# Patient Record
Sex: Male | Born: 1985
Health system: Southern US, Community
[De-identification: ages and names within clinical notes are randomized; demographics above are authoritative.]

## PROBLEM LIST (undated history)

## (undated) DIAGNOSIS — F909 Attention-deficit hyperactivity disorder, unspecified type: Secondary | ICD-10-CM

## (undated) HISTORY — PX: WISDOM TOOTH EXTRACTION: SHX21

## (undated) HISTORY — PX: APPENDECTOMY: SHX54

---
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
## 2015-07-26 ENCOUNTER — Ambulatory Visit: Payer: Self-pay | Admitting: Pediatrics

## 2017-07-30 ENCOUNTER — Ambulatory Visit (INDEPENDENT_AMBULATORY_CARE_PROVIDER_SITE_OTHER): Payer: BLUE CROSS/BLUE SHIELD | Admitting: Family

## 2017-07-30 ENCOUNTER — Other Ambulatory Visit: Payer: Self-pay | Admitting: Family

## 2017-07-30 ENCOUNTER — Encounter: Payer: Self-pay | Admitting: Family

## 2017-07-30 VITALS — BP 122/73 | HR 70 | Temp 99.6°F | Ht 70.0 in | Wt 173.0 lb

## 2017-07-30 DIAGNOSIS — B349 Viral infection, unspecified: Secondary | ICD-10-CM | POA: Diagnosis not present

## 2017-07-30 DIAGNOSIS — J029 Acute pharyngitis, unspecified: Secondary | ICD-10-CM

## 2017-07-30 LAB — RAPID STREP SCREEN (MED CTR MEBANE ONLY): STREP GP A AG, IA W/REFLEX: POSITIVE — AB

## 2017-07-30 MED ORDER — AMOXICILLIN 875 MG PO TABS: 875.0000 mg | ORAL_TABLET | Freq: Two times a day (BID) | ORAL | 0 refills | Status: DC

## 2017-07-30 MED ORDER — FLUTICASONE PROPIONATE 50 MCG/ACT NA SUSP: 2.0000 | Freq: Every day | NASAL | 6 refills | Status: DC

## 2017-07-30 NOTE — Patient Instructions (Signed)
Upper Respiratory Infection, Adult Most upper respiratory infections (URIs) are a viral infection of the air passages leading to the lungs. A URI affects the nose, throat, and upper air passages. The most common type of URI is nasopharyngitis and is typically referred to as "the common cold." URIs run their course and usually go away on their own. Most of the time, a URI does not require medical attention, but sometimes a bacterial infection in the upper airways can follow a viral infection. This is called a secondary infection. Sinus and middle ear infections are common types of secondary upper respiratory infections. Bacterial pneumonia can also complicate a URI. A URI can worsen asthma and chronic obstructive pulmonary disease (COPD). Sometimes, these complications can require emergency medical care and may be life threatening. What are the causes? Almost all URIs are caused by viruses. A virus is a type of germ and can spread from one person to another. What increases the risk? You may be at risk for a URI if:  You smoke.  You have chronic heart or lung disease.  You have a weakened defense (immune) system.  You are very young or very old.  You have nasal allergies or asthma.  You work in crowded or poorly ventilated areas.  You work in health care facilities or schools.  What are the signs or symptoms? Symptoms typically develop 2-3 days after you come in contact with a cold virus. Most viral URIs last 7-10 days. However, viral URIs from the influenza virus (flu virus) can last 14-18 days and are typically more severe. Symptoms may include:  Runny or stuffy (congested) nose.  Sneezing.  Cough.  Sore throat.  Headache.  Fatigue.  Fever.  Loss of appetite.  Pain in your forehead, behind your eyes, and over your cheekbones (sinus pain).  Muscle aches.  How is this diagnosed? Your health care provider may diagnose a URI by:  Physical exam.  Tests to check that your  symptoms are not due to another condition such as: ? Strep throat. ? Sinusitis. ? Pneumonia. ? Asthma.  How is this treated? A URI goes away on its own with time. It cannot be cured with medicines, but medicines may be prescribed or recommended to relieve symptoms. Medicines may help:  Reduce your fever.  Reduce your cough.  Relieve nasal congestion.  Follow these instructions at home:  Take medicines only as directed by your health care provider.  Gargle warm saltwater or take cough drops to comfort your throat as directed by your health care provider.  Use a warm mist humidifier or inhale steam from a shower to increase air moisture. This may make it easier to breathe.  Drink enough fluid to keep your urine clear or pale yellow.  Eat soups and other clear broths and maintain good nutrition.  Rest as needed.  Return to work when your temperature has returned to normal or as your health care provider advises. You may need to stay home longer to avoid infecting others. You can also use a face mask and careful hand washing to prevent spread of the virus.  Increase the usage of your inhaler if you have asthma.  Do not use any tobacco products, including cigarettes, chewing tobacco, or electronic cigarettes. If you need help quitting, ask your health care provider. How is this prevented? The best way to protect yourself from getting a cold is to practice good hygiene.  Avoid oral or hand contact with people with cold symptoms.  Wash your   hands often if contact occurs.  There is no clear evidence that vitamin C, vitamin E, echinacea, or exercise reduces the chance of developing a cold. However, it is always recommended to get plenty of rest, exercise, and practice good nutrition. Contact a health care provider if:  You are getting worse rather than better.  Your symptoms are not controlled by medicine.  You have chills.  You have worsening shortness of breath.  You have  brown or red mucus.  You have yellow or brown nasal discharge.  You have pain in your face, especially when you bend forward.  You have a fever.  You have swollen neck glands.  You have pain while swallowing.  You have white areas in the back of your throat. Get help right away if:  You have severe or persistent: ? Headache. ? Ear pain. ? Sinus pain. ? Chest pain.  You have chronic lung disease and any of the following: ? Wheezing. ? Prolonged cough. ? Coughing up blood. ? A change in your usual mucus.  You have a stiff neck.  You have changes in your: ? Vision. ? Hearing. ? Thinking. ? Mood. This information is not intended to replace advice given to you by your health care provider. Make sure you discuss any questions you have with your health care provider. Document Released: 03/14/2001 Document Revised: 05/21/2016 Document Reviewed: 12/24/2013 Elsevier Interactive Patient Education  2017 [employer]  

## 2017-07-30 NOTE — Progress Notes (Signed)
   Subjective:    Patient ID: Richard Ali, male    DOB: 02-Dec-1985, 31 y.o.   MRN: [id number]  PT presents to the office today to establish care and complaints of sore throat.  Headache   This is a new problem. Associated symptoms include a fever, muscle aches and a sore throat. Pertinent negatives include no coughing, ear pain, nausea or vomiting.  Fever   This is a new problem. The current episode started yesterday. Associated symptoms include headaches, muscle aches and a sore throat. Pertinent negatives include no coughing, ear pain, nausea, sleepiness, urinary pain or vomiting. He has tried acetaminophen for the symptoms. The treatment provided mild relief.      Review of Systems  Constitutional: Positive for fever.  HENT: Positive for sore throat. Negative for ear pain.   Respiratory: Negative for cough.   Gastrointestinal: Negative for nausea and vomiting.  Genitourinary: Negative for dysuria.  Neurological: Positive for headaches.  All other systems reviewed and are negative.      Objective:   Physical Exam  Constitutional: He is oriented to person, place, and time. He appears well-developed and well-nourished. No distress.  HENT:  Head: Normocephalic.  Right Ear: External ear normal.  Left Ear: External ear normal.  Nose: Mucosal edema and rhinorrhea present.  Mouth/Throat: Posterior oropharyngeal erythema present.  Eyes: Pupils are equal, round, and reactive to light. Right eye exhibits no discharge. Left eye exhibits no discharge.  Neck: Normal range of motion. Neck supple. No thyromegaly present.  Cardiovascular: Normal rate, regular rhythm, normal heart sounds and intact distal pulses.   No murmur heard. Pulmonary/Chest: Effort normal and breath sounds normal. No respiratory distress. He has no wheezes.  Abdominal: Soft. Bowel sounds are normal. He exhibits no distension. There is no tenderness.  Musculoskeletal: Normal range of motion. He exhibits no edema  or tenderness.  Neurological: He is alert and oriented to person, place, and time.  Skin: Skin is warm and dry. No rash noted. No erythema.  Psychiatric: He has a normal mood and affect. His behavior is normal. Judgment and thought content normal.  Vitals reviewed.     BP 122/73   Pulse 70   Temp 99.6 F (37.6 C) (Oral)   Ht 5\' 10"  (1.778 m)   Wt 173 lb (78.5 kg)   BMI 24.82 kg/m      Assessment & Plan:  1. Viral illness - fluticasone (FLONASE) 50 MCG/ACT nasal spray; Place 2 sprays into both nostrils daily.  Dispense: 16 g; Refill: 6  2. Sore throat - Take meds as prescribed - Use a cool mist humidifier  -Use saline nose sprays frequently -Force fluids -For any cough or congestion  Use plain Mucinex- regular strength or max strength is fine -For fever or aces or pains- take tylenol or ibuprofen appropriate for age and weight. -Throat lozenges if help -New toothbrush in 3 days - fluticasone (FLONASE) 50 MCG/ACT nasal spray; Place 2 sprays into both nostrils daily.  Dispense: 16 g; Refill: 6 - Rapid strep screen ([facility])   Jannifer Rodneyhristy Arlena Marsan, FNP

## 2017-07-30 NOTE — Addendum Note (Signed)
Addended by: Jannifer RodneyHAWKS, Miachel Nardelli A on: 07/30/2017 03:49 PM   Modules accepted: Level of Service

## 2020-02-04 ENCOUNTER — Ambulatory Visit (INDEPENDENT_AMBULATORY_CARE_PROVIDER_SITE_OTHER): Payer: 59 | Admitting: Family

## 2020-02-04 ENCOUNTER — Other Ambulatory Visit: Payer: Self-pay

## 2020-02-04 ENCOUNTER — Encounter: Payer: Self-pay | Admitting: Family

## 2020-02-04 ENCOUNTER — Ambulatory Visit: Payer: BLUE CROSS/BLUE SHIELD | Admitting: Family

## 2020-02-04 VITALS — BP 127/83 | HR 97 | Temp 98.1°F | Ht 70.0 in | Wt 196.4 lb

## 2020-02-04 DIAGNOSIS — F902 Attention-deficit hyperactivity disorder, combined type: Secondary | ICD-10-CM | POA: Diagnosis not present

## 2020-02-04 DIAGNOSIS — F909 Attention-deficit hyperactivity disorder, unspecified type: Secondary | ICD-10-CM | POA: Diagnosis not present

## 2020-02-04 DIAGNOSIS — Z79899 Other long term (current) drug therapy: Secondary | ICD-10-CM | POA: Diagnosis not present

## 2020-02-04 DIAGNOSIS — Z23 Encounter for immunization: Secondary | ICD-10-CM

## 2020-02-04 MED ORDER — LISDEXAMFETAMINE DIMESYLATE 30 MG PO CAPS: 30.0000 mg | ORAL_CAPSULE | Freq: Every day | ORAL | 0 refills | Status: DC

## 2020-02-04 MED FILL — VYVANSE 30 MG CAPSULE: 30 | 30 days supply | Qty: 30 | Fill #0

## 2020-02-04 NOTE — Patient Instructions (Signed)

## 2020-02-04 NOTE — Progress Notes (Signed)
Subjective:    Patient ID: Richard Ali, male    DOB: [birth year], 34 y.o.   MRN: [id number]  Chief Complaint  Patient presents with  . ADHD    HPI Pt presents to the office today to discuss ADHD. He states he has troubling completing tasks at home. He states his wife will tell him several things to completed and he gets distracted and never completes. He states he struggled in school, but was able to pass. He is currently in college, but doing virtual learning his grades A, B's, and C's. He states he has can not stay on topic to complete his work.    Review of Systems  All other systems reviewed and are negative.      Objective:   Physical Exam Vitals reviewed.  Constitutional:      General: He is not in acute distress.    Appearance: He is well-developed.  HENT:     Head: Normocephalic.     Right Ear: Tympanic membrane normal.     Left Ear: Tympanic membrane normal.  Eyes:     General:        Right eye: No discharge.        Left eye: No discharge.     Pupils: Pupils are equal, round, and reactive to light.  Neck:     Thyroid: No thyromegaly.  Cardiovascular:     Rate and Rhythm: Normal rate and regular rhythm.     Heart sounds: Normal heart sounds. No murmur.  Pulmonary:     Effort: Pulmonary effort is normal. No respiratory distress.     Breath sounds: Normal breath sounds. No wheezing.  Abdominal:     General: Bowel sounds are normal. There is no distension.     Palpations: Abdomen is soft.     Tenderness: There is no abdominal tenderness.  Musculoskeletal:        General: No tenderness. Normal range of motion.     Cervical back: Normal range of motion and neck supple.  Skin:    General: Skin is warm and dry.     Findings: No erythema or rash.  Neurological:     Mental Status: He is alert and oriented to person, place, and time.     Cranial Nerves: No cranial nerve deficit.     Deep Tendon Reflexes: Reflexes are normal and symmetric.  Psychiatric:         Behavior: Behavior normal.        Thought Content: Thought content normal.        Judgment: Judgment normal.      BP 127/83   Pulse 97   Temp 98.1 F (36.7 C)   Ht 5\' 10"  (1.778 m)   Wt 196 lb 6.4 oz (89.1 kg)   SpO2 97%   BMI 28.18 kg/m         Assessment & Plan:  ROSBEL BUCKNER comes in today with chief complaint of ADHD   Diagnosis and orders addressed:  1. Attention deficit hyperactivity disorder (ADHD), combined type Meds as prescribed Behavior modification as needed Follow-up for recheck in 3 months - ToxASSURE Select 13 (MW), Urine - lisdexamfetamine (VYVANSE) 30 MG capsule; Take 1 capsule (30 mg total) by mouth daily before breakfast.  Dispense: 30 capsule; Refill: 0 - lisdexamfetamine (VYVANSE) 30 MG capsule; Take 1 capsule (30 mg total) by mouth daily before breakfast.  Dispense: 30 capsule; Refill: 0 - lisdexamfetamine (VYVANSE) 30 MG capsule; Take 1 capsule (30 mg total)  by mouth daily before breakfast.  Dispense: 30 capsule; Refill: 0  2. Attention deficit hyperactivity disorder (ADHD), unspecified ADHD type - ToxASSURE Select 13 (MW), Urine  3. Controlled substance agreement signed  4. Need for Tdap vaccination  - Tdap vaccine greater than or equal to 7yo IM   Follow up plan: 3  Months   Evelina Dun, FNP

## 2020-02-09 LAB — TOXASSURE SELECT 13 (MW), URINE

## 2020-05-06 ENCOUNTER — Encounter: Payer: Self-pay | Admitting: Family

## 2020-05-06 ENCOUNTER — Other Ambulatory Visit: Payer: Self-pay

## 2020-05-06 ENCOUNTER — Ambulatory Visit (INDEPENDENT_AMBULATORY_CARE_PROVIDER_SITE_OTHER): Payer: 59 | Admitting: Family

## 2020-05-06 VITALS — BP 124/77 | HR 78 | Temp 98.1°F | Ht 70.0 in | Wt 188.4 lb

## 2020-05-06 DIAGNOSIS — F909 Attention-deficit hyperactivity disorder, unspecified type: Secondary | ICD-10-CM

## 2020-05-06 DIAGNOSIS — Z79899 Other long term (current) drug therapy: Secondary | ICD-10-CM

## 2020-05-06 MED ORDER — ATOMOXETINE HCL 40 MG PO CAPS: 40.0000 mg | ORAL_CAPSULE | Freq: Every day | ORAL | 2 refills | Status: DC

## 2020-05-06 NOTE — Progress Notes (Signed)
Subjective:    Patient ID: Richard Ali, male    DOB: 03-08-1986, 34 y.o.   MRN: [id number]  Chief Complaint  Patient presents with  . ADHD    discuss maybe a change in medication, can not sleep on medication     HPI PT presents to the office today for ADHD follow up. He reports the Vyvanse 30 mg is helping him stay focused at school and completing tasks. However, he states it has causes insomnia. He has tried taking as early as 7 AM, but would have difficulty falling a sleep. He has a newborn at home and needs to be able to fall asleep when he can. He is requesting to try another medication.   Review of Systems  Constitutional: Negative.   HENT: Negative.   Respiratory: Negative.   Cardiovascular: Negative.   Gastrointestinal: Negative.   Endocrine: Negative.   Genitourinary: Negative.   Musculoskeletal: Negative.   Neurological: Negative.   Hematological: Negative.   Psychiatric/Behavioral: Negative.   All other systems reviewed and are negative.      Objective:   Physical Exam Vitals reviewed.  Constitutional:      General: He is not in acute distress.    Appearance: He is well-developed.  HENT:     Head: Normocephalic.     Right Ear: Tympanic membrane normal.     Left Ear: Tympanic membrane normal.  Eyes:     General:        Right eye: No discharge.        Left eye: No discharge.     Pupils: Pupils are equal, round, and reactive to light.  Neck:     Thyroid: No thyromegaly.  Cardiovascular:     Rate and Rhythm: Normal rate and regular rhythm.     Heart sounds: Normal heart sounds. No murmur heard.   Pulmonary:     Effort: Pulmonary effort is normal. No respiratory distress.     Breath sounds: Normal breath sounds. No wheezing.  Abdominal:     General: Bowel sounds are normal. There is no distension.     Palpations: Abdomen is soft.     Tenderness: There is no abdominal tenderness.  Musculoskeletal:        General: No tenderness. Normal range of  motion.     Cervical back: Normal range of motion and neck supple.  Skin:    General: Skin is warm and dry.     Findings: No erythema or rash.  Neurological:     Mental Status: He is alert and oriented to person, place, and time.     Cranial Nerves: No cranial nerve deficit.     Deep Tendon Reflexes: Reflexes are normal and symmetric.  Psychiatric:        Behavior: Behavior normal.        Thought Content: Thought content normal.        Judgment: Judgment normal.     BP 124/77   Pulse 78   Temp 98.1 F (36.7 C) (Temporal)   Ht 5\' 10"  (1.778 m)   Wt 188 lb 6.4 oz (85.5 kg)   SpO2 98%   BMI 27.03 kg/m        Assessment & Plan:  Richard Ali comes in today with chief complaint of ADHD (discuss maybe a change in medication, can not sleep on medication )   Diagnosis and orders addressed:  1. Attention deficit hyperactivity disorder (ADHD), unspecified ADHD type Will stop Vyvanse 30 mg and start  Strattera  Meds as prescribed Behavior modification as needed Follow-up for recheck in 3 months - atomoxetine (STRATTERA) 40 MG capsule; Take 1 capsule (40 mg total) by mouth daily.  Dispense: 30 capsule; Refill: 2  2. Controlled substance agreement signed   Health Maintenance reviewed Diet and exercise encouraged  Follow up plan: 3 months    Richard Rodney, FNP

## 2020-05-06 NOTE — Patient Instructions (Signed)

## 2020-05-21 ENCOUNTER — Other Ambulatory Visit: Payer: Self-pay | Admitting: Family

## 2020-05-21 MED ORDER — LISDEXAMFETAMINE DIMESYLATE 30 MG PO CAPS: 30.0000 mg | ORAL_CAPSULE | Freq: Every day | ORAL | 0 refills | Status: DC

## 2020-05-25 ENCOUNTER — Other Ambulatory Visit: Payer: Self-pay | Admitting: Family

## 2020-05-25 MED ORDER — AMPHETAMINE-DEXTROAMPHET ER 10 MG PO CP24: 10.0000 mg | ORAL_CAPSULE | ORAL | 0 refills | Status: DC

## 2020-06-01 DIAGNOSIS — Z20828 Contact with and (suspected) exposure to other viral communicable diseases: Secondary | ICD-10-CM | POA: Diagnosis not present

## 2020-08-06 ENCOUNTER — Ambulatory Visit: Payer: 59 | Admitting: Family

## 2020-08-06 ENCOUNTER — Other Ambulatory Visit: Payer: Self-pay

## 2020-08-06 ENCOUNTER — Encounter: Payer: Self-pay | Admitting: Family

## 2020-08-06 VITALS — BP 129/76 | HR 94 | Temp 97.9°F | Ht 70.0 in | Wt 187.0 lb

## 2020-08-06 DIAGNOSIS — F909 Attention-deficit hyperactivity disorder, unspecified type: Secondary | ICD-10-CM

## 2020-08-06 DIAGNOSIS — Z79899 Other long term (current) drug therapy: Secondary | ICD-10-CM | POA: Diagnosis not present

## 2020-08-06 MED ORDER — AMPHETAMINE-DEXTROAMPHETAMINE 20 MG PO TABS: 20.0000 mg | ORAL_TABLET | Freq: Every day | ORAL | 0 refills | Status: DC

## 2020-08-06 NOTE — Patient Instructions (Signed)

## 2020-08-06 NOTE — Progress Notes (Signed)
Subjective:    Patient ID: Richard Ali, male    DOB: November 17, 1985, 34 y.o.   MRN: [id number]  Chief Complaint  Patient presents with  . ADHD    HPI PT presents to the office today for ADHD follow up. He is taking Adderall XR 10 mg today. He has tried Vyvanse but could not sleep and was very expensive. He tried Theatre manager and it gave him chest pain.   He reports the Adderall is helping him staying focused, but seems to be more fidgety than with the Vyvanse.   He does not take this daily and usually takes Tuesday-Thursday.    Review of Systems  All other systems reviewed and are negative.      Objective:   Physical Exam Vitals reviewed.  Constitutional:      General: He is not in acute distress.    Appearance: He is well-developed.  HENT:     Head: Normocephalic.     Right Ear: Tympanic membrane normal.     Left Ear: Tympanic membrane normal.  Eyes:     General:        Right eye: No discharge.        Left eye: No discharge.     Pupils: Pupils are equal, round, and reactive to light.  Neck:     Thyroid: No thyromegaly.  Cardiovascular:     Rate and Rhythm: Normal rate and regular rhythm.     Heart sounds: Normal heart sounds. No murmur heard.   Pulmonary:     Effort: Pulmonary effort is normal. No respiratory distress.     Breath sounds: Normal breath sounds. No wheezing.  Abdominal:     General: Bowel sounds are normal. There is no distension.     Palpations: Abdomen is soft.     Tenderness: There is no abdominal tenderness.  Musculoskeletal:        General: No tenderness. Normal range of motion.     Cervical back: Normal range of motion and neck supple.  Skin:    General: Skin is warm and dry.     Findings: No erythema or rash.  Neurological:     Mental Status: He is alert and oriented to person, place, and time.     Cranial Nerves: No cranial nerve deficit.     Deep Tendon Reflexes: Reflexes are normal and symmetric.  Psychiatric:        Behavior:  Behavior normal.        Thought Content: Thought content normal.        Judgment: Judgment normal.       BP 129/76   Pulse 94   Temp 97.9 F (36.6 C) (Temporal)   Ht 5\' 10"  (1.778 m)   Wt 187 lb (84.8 kg)   SpO2 99%   BMI 26.83 kg/m      Assessment & Plan:  Richard Ali comes in today with chief complaint of ADHD   Diagnosis and orders addressed:  1. Controlled substance agreement signed Meds as prescribed Behavior modification as needed Follow-up for recheck in 3 months - amphetamine-dextroamphetamine (ADDERALL) 20 MG tablet; Take 1 tablet (20 mg total) by mouth daily before breakfast.  Dispense: 30 tablet; Refill: 0 - amphetamine-dextroamphetamine (ADDERALL) 20 MG tablet; Take 1 tablet (20 mg total) by mouth daily before breakfast.  Dispense: 30 tablet; Refill: 0 - amphetamine-dextroamphetamine (ADDERALL) 20 MG tablet; Take 1 tablet (20 mg total) by mouth daily before breakfast.  Dispense: 30 tablet; Refill: 0  2.  Attention deficit hyperactivity disorder (ADHD), unspecified ADHD type - amphetamine-dextroamphetamine (ADDERALL) 20 MG tablet; Take 1 tablet (20 mg total) by mouth daily before breakfast.  Dispense: 30 tablet; Refill: 0 - amphetamine-dextroamphetamine (ADDERALL) 20 MG tablet; Take 1 tablet (20 mg total) by mouth daily before breakfast.  Dispense: 30 tablet; Refill: 0 - amphetamine-dextroamphetamine (ADDERALL) 20 MG tablet; Take 1 tablet (20 mg total) by mouth daily before breakfast.  Dispense: 30 tablet; Refill: 0   Jannifer Rodney, FNP

## 2020-11-10 ENCOUNTER — Other Ambulatory Visit: Payer: Self-pay

## 2020-11-10 ENCOUNTER — Encounter: Payer: Self-pay | Admitting: Family

## 2020-11-10 ENCOUNTER — Ambulatory Visit (INDEPENDENT_AMBULATORY_CARE_PROVIDER_SITE_OTHER): Payer: 59 | Admitting: Family

## 2020-11-10 VITALS — BP 127/76 | HR 72 | Temp 97.3°F | Ht 71.0 in | Wt 186.4 lb

## 2020-11-10 DIAGNOSIS — F909 Attention-deficit hyperactivity disorder, unspecified type: Secondary | ICD-10-CM

## 2020-11-10 DIAGNOSIS — Z0001 Encounter for general adult medical examination with abnormal findings: Secondary | ICD-10-CM | POA: Diagnosis not present

## 2020-11-10 DIAGNOSIS — Z114 Encounter for screening for human immunodeficiency virus [HIV]: Secondary | ICD-10-CM

## 2020-11-10 DIAGNOSIS — Z79899 Other long term (current) drug therapy: Secondary | ICD-10-CM | POA: Diagnosis not present

## 2020-11-10 DIAGNOSIS — Z0000 Encounter for general adult medical examination without abnormal findings: Secondary | ICD-10-CM | POA: Diagnosis not present

## 2020-11-10 DIAGNOSIS — Z1159 Encounter for screening for other viral diseases: Secondary | ICD-10-CM | POA: Diagnosis not present

## 2020-11-10 MED ORDER — AMPHETAMINE-DEXTROAMPHETAMINE 20 MG PO TABS: 20.0000 mg | ORAL_TABLET | Freq: Every day | ORAL | 0 refills | Status: DC

## 2020-11-10 NOTE — Patient Instructions (Signed)

## 2020-11-10 NOTE — Progress Notes (Signed)
Subjective:    Patient ID: Richard Ali, male    DOB: [birth year], 35 y.o.   MRN: [id number]  Chief Complaint  Patient presents with  . Annual Exam  . ADHD    HPI Pt presents to the office today for CPE. He is currently taking Adderall 20 mg three times a week when he is in class. States this is helping him focused and on task.   Pt denies any headache, palpitations, SOB, or edema at this time.     Review of Systems  All other systems reviewed and are negative.      Objective:   Physical Exam Vitals reviewed.  Constitutional:      General: He is not in acute distress.    Appearance: He is well-developed and well-nourished.  HENT:     Head: Normocephalic.     Right Ear: Tympanic membrane normal.     Left Ear: Tympanic membrane normal.     Mouth/Throat:     Mouth: Oropharynx is clear and moist.  Eyes:     General:        Right eye: No discharge.        Left eye: No discharge.     Pupils: Pupils are equal, round, and reactive to light.  Neck:     Thyroid: No thyromegaly.  Cardiovascular:     Rate and Rhythm: Normal rate and regular rhythm.     Pulses: Intact distal pulses.     Heart sounds: Normal heart sounds. No murmur heard.   Pulmonary:     Effort: Pulmonary effort is normal. No respiratory distress.     Breath sounds: Normal breath sounds. No wheezing.  Abdominal:     General: Bowel sounds are normal. There is no distension.     Palpations: Abdomen is soft.     Tenderness: There is no abdominal tenderness.  Musculoskeletal:        General: No tenderness or edema. Normal range of motion.     Cervical back: Normal range of motion and neck supple.  Skin:    General: Skin is warm and dry.     Findings: No erythema or rash.  Neurological:     Mental Status: He is alert and oriented to person, place, and time.     Cranial Nerves: No cranial nerve deficit.     Deep Tendon Reflexes: Reflexes are normal and symmetric.  Psychiatric:        Mood and  Affect: Mood and affect normal.        Behavior: Behavior normal.        Thought Content: Thought content normal.        Judgment: Judgment normal.       BP 127/76   Pulse 72   Temp (!) 97.3 F (36.3 C) (Temporal)   Ht 5' 11"  (1.803 m)   Wt 186 lb 6.4 oz (84.6 kg)   BMI 26.00 kg/m      Assessment & Plan:  Richard Ali comes in today with chief complaint of Annual Exam and ADHD   Diagnosis and orders addressed:  1. Controlled substance agreement signed - amphetamine-dextroamphetamine (ADDERALL) 20 MG tablet; Take 1 tablet (20 mg total) by mouth daily before breakfast.  Dispense: 30 tablet; Refill: 0 - amphetamine-dextroamphetamine (ADDERALL) 20 MG tablet; Take 1 tablet (20 mg total) by mouth daily before breakfast.  Dispense: 30 tablet; Refill: 0 - amphetamine-dextroamphetamine (ADDERALL) 20 MG tablet; Take 1 tablet (20 mg total) by mouth daily before  breakfast.  Dispense: 30 tablet; Refill: 0 - CMP14+EGFR - CBC with Differential/Platelet  2. Attention deficit hyperactivity disorder (ADHD), unspecified ADHD type Meds as prescribed Behavior modification as needed Follow-up for recheck in 3 months - amphetamine-dextroamphetamine (ADDERALL) 20 MG tablet; Take 1 tablet (20 mg total) by mouth daily before breakfast.  Dispense: 30 tablet; Refill: 0 - amphetamine-dextroamphetamine (ADDERALL) 20 MG tablet; Take 1 tablet (20 mg total) by mouth daily before breakfast.  Dispense: 30 tablet; Refill: 0 - amphetamine-dextroamphetamine (ADDERALL) 20 MG tablet; Take 1 tablet (20 mg total) by mouth daily before breakfast.  Dispense: 30 tablet; Refill: 0 - CMP14+EGFR - CBC with Differential/Platelet  3. Annual physical exam - CMP14+EGFR - CBC with Differential/Platelet - Lipid panel - TSH - Hepatitis C antibody - HIV Antibody (routine testing w rflx)  4. Need for hepatitis C screening test - CMP14+EGFR - CBC with Differential/Platelet - Hepatitis C antibody  5. Encounter  for screening for HIV - CMP14+EGFR - CBC with Differential/Platelet - HIV Antibody (routine testing w rflx)   Labs pending Health Maintenance reviewed Diet and exercise encouraged  Follow up plan: 3 months    Evelina Dun, FNP

## 2020-11-11 LAB — CBC WITH DIFFERENTIAL/PLATELET
Basophils Absolute: 0.1 10*3/uL (ref 0.0–0.2)
Basos: 1 %
EOS (ABSOLUTE): 0.1 10*3/uL (ref 0.0–0.4)
Eos: 1 %
Hematocrit: 45.5 % (ref 37.5–51.0)
Hemoglobin: 15.9 g/dL (ref 13.0–17.7)
Immature Grans (Abs): 0 10*3/uL (ref 0.0–0.1)
Immature Granulocytes: 0 %
Lymphocytes Absolute: 2 10*3/uL (ref 0.7–3.1)
Lymphs: 24 %
MCH: 30.2 pg (ref 26.6–33.0)
MCHC: 34.9 g/dL (ref 31.5–35.7)
MCV: 86 fL (ref 79–97)
Monocytes Absolute: 0.7 10*3/uL (ref 0.1–0.9)
Monocytes: 8 %
Neutrophils Absolute: 5.5 10*3/uL (ref 1.4–7.0)
Neutrophils: 66 %
Platelets: 285 10*3/uL (ref 150–450)
RBC: 5.27 x10E6/uL (ref 4.14–5.80)
RDW: 11.9 % (ref 11.6–15.4)
WBC: 8.4 10*3/uL (ref 3.4–10.8)

## 2020-11-11 LAB — CMP14+EGFR
ALT: 23 IU/L (ref 0–44)
AST: 25 IU/L (ref 0–40)
Albumin/Globulin Ratio: 2.1 (ref 1.2–2.2)
Albumin: 5 g/dL (ref 4.0–5.0)
Alkaline Phosphatase: 58 IU/L (ref 44–121)
BUN/Creatinine Ratio: 11 (ref 9–20)
BUN: 11 mg/dL (ref 6–20)
Bilirubin Total: 0.9 mg/dL (ref 0.0–1.2)
CO2: 23 mmol/L (ref 20–29)
Calcium: 10.2 mg/dL (ref 8.7–10.2)
Chloride: 100 mmol/L (ref 96–106)
Creatinine, Ser: 1 mg/dL (ref 0.76–1.27)
GFR calc Af Amer: 115 mL/min/{1.73_m2} (ref >59)
GFR calc non Af Amer: 99 mL/min/{1.73_m2} (ref >59)
Globulin, Total: 2.4 g/dL (ref 1.5–4.5)
Glucose: 70 mg/dL (ref 65–99)
Potassium: 4.5 mmol/L (ref 3.5–5.2)
Sodium: 144 mmol/L (ref 134–144)
Total Protein: 7.4 g/dL (ref 6.0–8.5)

## 2020-11-11 LAB — HIV ANTIBODY (ROUTINE TESTING W REFLEX): HIV Screen 4th Generation wRfx: NONREACTIVE

## 2020-11-11 LAB — LIPID PANEL
Chol/HDL Ratio: 3.6 ratio (ref 0.0–5.0)
Cholesterol, Total: 164 mg/dL (ref 100–199)
HDL: 45 mg/dL (ref >39)
LDL Chol Calc (NIH): 103 mg/dL — ABNORMAL HIGH (ref 0–99)
Triglycerides: 82 mg/dL (ref 0–149)
VLDL Cholesterol Cal: 16 mg/dL (ref 5–40)

## 2020-11-11 LAB — HEPATITIS C ANTIBODY: Hep C Virus Ab: <0.1 s/co ratio (ref 0.0–0.9)

## 2020-11-11 LAB — TSH: TSH: 2.41 u[IU]/mL (ref 0.450–4.500)

## 2021-03-10 ENCOUNTER — Ambulatory Visit: Payer: 59 | Admitting: Family

## 2021-03-10 ENCOUNTER — Other Ambulatory Visit: Payer: Self-pay

## 2021-03-10 ENCOUNTER — Encounter: Payer: Self-pay | Admitting: Family

## 2021-03-10 DIAGNOSIS — Z79899 Other long term (current) drug therapy: Secondary | ICD-10-CM | POA: Diagnosis not present

## 2021-03-10 DIAGNOSIS — F909 Attention-deficit hyperactivity disorder, unspecified type: Secondary | ICD-10-CM | POA: Diagnosis not present

## 2021-03-10 MED ORDER — AMPHETAMINE-DEXTROAMPHETAMINE 20 MG PO TABS: 20.0000 mg | ORAL_TABLET | Freq: Every day | ORAL | 0 refills | Status: DC

## 2021-03-10 MED ORDER — AMPHETAMINE-DEXTROAMPHETAMINE 20 MG PO TABS: 20.0000 mg | ORAL_TABLET | Freq: Every day | ORAL | 0 refills | Status: AC

## 2021-03-10 NOTE — Progress Notes (Signed)
Subjective:    Patient ID: Richard Ali, male    DOB: 23-Feb-1986, 35 y.o.   MRN: [id number]  Chief Complaint  Patient presents with   Medical Management of Chronic Issues    HPI Pt presents to the office today for ADHD follow up. He is currently taking Adderall 20 mg two- three times a week when he is in class. States this is helping him focused and on task.    Denies any adverse effects.    Review of Systems  All other systems reviewed and are negative.     Objective:   Physical Exam Vitals reviewed.  Constitutional:      General: He is not in acute distress.    Appearance: He is well-developed.  HENT:     Head: Normocephalic.     Right Ear: Tympanic membrane normal.     Left Ear: Tympanic membrane normal.  Eyes:     General:        Right eye: No discharge.        Left eye: No discharge.     Pupils: Pupils are equal, round, and reactive to light.  Neck:     Thyroid: No thyromegaly.  Cardiovascular:     Rate and Rhythm: Normal rate and regular rhythm.     Heart sounds: Normal heart sounds. No murmur heard. Pulmonary:     Effort: Pulmonary effort is normal. No respiratory distress.     Breath sounds: Normal breath sounds. No wheezing.  Abdominal:     General: Bowel sounds are normal. There is no distension.     Palpations: Abdomen is soft.     Tenderness: There is no abdominal tenderness.  Musculoskeletal:        General: No tenderness. Normal range of motion.     Cervical back: Normal range of motion and neck supple.  Skin:    General: Skin is warm and dry.     Findings: No erythema or rash.  Neurological:     Mental Status: He is alert and oriented to person, place, and time.     Cranial Nerves: No cranial nerve deficit.     Deep Tendon Reflexes: Reflexes are normal and symmetric.  Psychiatric:        Behavior: Behavior normal.        Thought Content: Thought content normal.        Judgment: Judgment normal.      BP 131/78   Pulse 74   Temp  98 F (36.7 C) (Temporal)   Ht 5\' 11"  (1.803 m)   Wt 184 lb 6.4 oz (83.6 kg)   BMI 25.72 kg/m      Assessment & Plan:  Richard Ali comes in today with chief complaint of Medical Management of Chronic Issues   Diagnosis and orders addressed:  1. Controlled substance agreement signed - amphetamine-dextroamphetamine (ADDERALL) 20 MG tablet; Take 1 tablet (20 mg total) by mouth daily before breakfast.  Dispense: 30 tablet; Refill: 0 - amphetamine-dextroamphetamine (ADDERALL) 20 MG tablet; Take 1 tablet (20 mg total) by mouth daily before breakfast.  Dispense: 30 tablet; Refill: 0 - amphetamine-dextroamphetamine (ADDERALL) 20 MG tablet; Take 1 tablet (20 mg total) by mouth daily before breakfast.  Dispense: 30 tablet; Refill: 0  2. Attention deficit hyperactivity disorder (ADHD), unspecified ADHD type Meds as prescribed Behavior modification as needed Follow-up for recheck in 3 months - amphetamine-dextroamphetamine (ADDERALL) 20 MG tablet; Take 1 tablet (20 mg total) by mouth daily before breakfast.  Dispense:  30 tablet; Refill: 0 - amphetamine-dextroamphetamine (ADDERALL) 20 MG tablet; Take 1 tablet (20 mg total) by mouth daily before breakfast.  Dispense: 30 tablet; Refill: 0 - amphetamine-dextroamphetamine (ADDERALL) 20 MG tablet; Take 1 tablet (20 mg total) by mouth daily before breakfast.  Dispense: 30 tablet; Refill: 0    Health Maintenance reviewed Diet and exercise encouraged  Follow up plan: 3 months    Jannifer Rodney, FNP

## 2021-03-10 NOTE — Patient Instructions (Signed)

## 2021-06-10 ENCOUNTER — Other Ambulatory Visit: Payer: Self-pay

## 2021-06-10 ENCOUNTER — Inpatient Hospital Stay (HOSPITAL_BASED_OUTPATIENT_CLINIC_OR_DEPARTMENT_OTHER)
Admission: EM | Admit: 2021-06-10 | Discharge: 2021-06-11 | DRG: 494 | Disposition: A | Discharge status: Home / Self Care | Payer: 59 | Attending: Student | Admitting: Student

## 2021-06-10 ENCOUNTER — Emergency Department (HOSPITAL_BASED_OUTPATIENT_CLINIC_OR_DEPARTMENT_OTHER): Payer: 59

## 2021-06-10 ENCOUNTER — Encounter (HOSPITAL_BASED_OUTPATIENT_CLINIC_OR_DEPARTMENT_OTHER): Payer: Self-pay

## 2021-06-10 DIAGNOSIS — Z91030 Bee allergy status: Secondary | ICD-10-CM

## 2021-06-10 DIAGNOSIS — Z79899 Other long term (current) drug therapy: Secondary | ICD-10-CM | POA: Diagnosis not present

## 2021-06-10 DIAGNOSIS — S82831A Other fracture of upper and lower end of right fibula, initial encounter for closed fracture: Secondary | ICD-10-CM | POA: Diagnosis not present

## 2021-06-10 DIAGNOSIS — S82431A Displaced oblique fracture of shaft of right fibula, initial encounter for closed fracture: Secondary | ICD-10-CM | POA: Diagnosis not present

## 2021-06-10 DIAGNOSIS — F909 Attention-deficit hyperactivity disorder, unspecified type: Secondary | ICD-10-CM | POA: Diagnosis present

## 2021-06-10 DIAGNOSIS — S82871A Displaced pilon fracture of right tibia, initial encounter for closed fracture: Principal | ICD-10-CM | POA: Diagnosis present

## 2021-06-10 DIAGNOSIS — Z419 Encounter for procedure for purposes other than remedying health state, unspecified: Secondary | ICD-10-CM

## 2021-06-10 DIAGNOSIS — S82301A Unspecified fracture of lower end of right tibia, initial encounter for closed fracture: Secondary | ICD-10-CM | POA: Diagnosis not present

## 2021-06-10 DIAGNOSIS — S82899A Other fracture of unspecified lower leg, initial encounter for closed fracture: Secondary | ICD-10-CM | POA: Diagnosis present

## 2021-06-10 DIAGNOSIS — Z820 Family history of epilepsy and other diseases of the nervous system: Secondary | ICD-10-CM

## 2021-06-10 DIAGNOSIS — M7989 Other specified soft tissue disorders: Secondary | ICD-10-CM | POA: Diagnosis not present

## 2021-06-10 DIAGNOSIS — Y9344 Activity, trampolining: Secondary | ICD-10-CM

## 2021-06-10 DIAGNOSIS — Y92838 Other recreation area as the place of occurrence of the external cause: Secondary | ICD-10-CM | POA: Diagnosis not present

## 2021-06-10 DIAGNOSIS — Z8249 Family history of ischemic heart disease and other diseases of the circulatory system: Secondary | ICD-10-CM

## 2021-06-10 DIAGNOSIS — T148XXA Other injury of unspecified body region, initial encounter: Secondary | ICD-10-CM

## 2021-06-10 DIAGNOSIS — G8918 Other acute postprocedural pain: Secondary | ICD-10-CM | POA: Diagnosis not present

## 2021-06-10 DIAGNOSIS — S8261XA Displaced fracture of lateral malleolus of right fibula, initial encounter for closed fracture: Secondary | ICD-10-CM | POA: Diagnosis not present

## 2021-06-10 DIAGNOSIS — S82891A Other fracture of right lower leg, initial encounter for closed fracture: Secondary | ICD-10-CM

## 2021-06-10 DIAGNOSIS — Z20822 Contact with and (suspected) exposure to covid-19: Secondary | ICD-10-CM | POA: Diagnosis not present

## 2021-06-10 DIAGNOSIS — S99911A Unspecified injury of right ankle, initial encounter: Secondary | ICD-10-CM | POA: Diagnosis not present

## 2021-06-10 DIAGNOSIS — S82391A Other fracture of lower end of right tibia, initial encounter for closed fracture: Secondary | ICD-10-CM | POA: Diagnosis not present

## 2021-06-10 HISTORY — DX: Attention-deficit hyperactivity disorder, unspecified type: F90.9

## 2021-06-10 LAB — BASIC METABOLIC PANEL
Anion gap: 10 (ref 5–15)
BUN: 10 mg/dL (ref 6–20)
CO2: 24 mmol/L (ref 22–32)
Calcium: 9.2 mg/dL (ref 8.9–10.3)
Chloride: 105 mmol/L (ref 98–111)
Creatinine, Ser: 1.08 mg/dL (ref 0.61–1.24)
GFR, Estimated: >60 mL/min (ref >60)
Glucose, Bld: 108 mg/dL — ABNORMAL HIGH (ref 70–99)
Potassium: 3.4 mmol/L — ABNORMAL LOW (ref 3.5–5.1)
Sodium: 139 mmol/L (ref 135–145)

## 2021-06-10 LAB — CBC WITH DIFFERENTIAL/PLATELET
Abs Immature Granulocytes: 0.11 10*3/uL — ABNORMAL HIGH (ref 0.00–0.07)
Basophils Absolute: 0.1 10*3/uL (ref 0.0–0.1)
Basophils Relative: 0 %
Eosinophils Absolute: 0 10*3/uL (ref 0.0–0.5)
Eosinophils Relative: 0 %
HCT: 42.6 % (ref 39.0–52.0)
Hemoglobin: 15.3 g/dL (ref 13.0–17.0)
Immature Granulocytes: 1 %
Lymphocytes Relative: 8 %
Lymphs Abs: 1.5 10*3/uL (ref 0.7–4.0)
MCH: 30.5 pg (ref 26.0–34.0)
MCHC: 35.9 g/dL (ref 30.0–36.0)
MCV: 85 fL (ref 80.0–100.0)
Monocytes Absolute: 1 10*3/uL (ref 0.1–1.0)
Monocytes Relative: 5 %
Neutro Abs: 17.3 10*3/uL — ABNORMAL HIGH (ref 1.7–7.7)
Neutrophils Relative %: 86 %
Platelets: 286 10*3/uL (ref 150–400)
RBC: 5.01 MIL/uL (ref 4.22–5.81)
RDW: 12.6 % (ref 11.5–15.5)
WBC: 20 10*3/uL — ABNORMAL HIGH (ref 4.0–10.5)
nRBC: 0 % (ref 0.0–0.2)

## 2021-06-10 LAB — RESP PANEL BY RT-PCR (FLU A&B, COVID) ARPGX2
Influenza A by PCR: NEGATIVE
Influenza B by PCR: NEGATIVE
SARS Coronavirus 2 by RT PCR: NEGATIVE

## 2021-06-10 MED ORDER — METHOCARBAMOL 1000 MG/10ML IJ SOLN
500.0000 mg | Freq: Four times a day (QID) | INTRAVENOUS | Status: DC | PRN
  Filled 2021-06-10: qty 5

## 2021-06-10 MED ORDER — ONDANSETRON HCL 4 MG PO TABS
4.0000 mg | ORAL_TABLET | Freq: Four times a day (QID) | ORAL | Status: DC | PRN
  Filled 2021-06-10: qty 1

## 2021-06-10 MED ORDER — HYDROMORPHONE HCL 1 MG/ML IJ SOLN
1.0000 mg | Freq: Once | INTRAMUSCULAR | Status: AC
  Administered 2021-06-10: 1 mg via INTRAVENOUS
  Filled 2021-06-10: qty 1

## 2021-06-10 MED ORDER — ONDANSETRON HCL 4 MG/2ML IJ SOLN: 4.0000 mg | Freq: Four times a day (QID) | INTRAMUSCULAR | Status: DC | PRN

## 2021-06-10 MED ORDER — POLYETHYLENE GLYCOL 3350 17 G PO PACK: 17.0000 g | PACK | Freq: Every day | ORAL | Status: DC | PRN

## 2021-06-10 MED ORDER — HYDROMORPHONE HCL 1 MG/ML IJ SOLN
1.0000 mg | INTRAMUSCULAR | Status: DC | PRN
Start: 2021-06-10 — End: 2021-06-11
  Administered 2021-06-11: 1 mg via INTRAVENOUS
  Filled 2021-06-10: qty 1

## 2021-06-10 MED ORDER — MORPHINE SULFATE (PF) 4 MG/ML IV SOLN
4.0000 mg | Freq: Once | INTRAVENOUS | Status: AC
  Administered 2021-06-10: 4 mg via INTRAVENOUS
  Filled 2021-06-10: qty 1

## 2021-06-10 MED ORDER — METHOCARBAMOL 500 MG PO TABS
500.0000 mg | ORAL_TABLET | Freq: Four times a day (QID) | ORAL | Status: DC | PRN
  Administered 2021-06-11: 500 mg via ORAL
  Filled 2021-06-10: qty 1

## 2021-06-10 MED ORDER — DIPHENHYDRAMINE HCL 12.5 MG/5ML PO ELIX: 12.5000 mg | ORAL_SOLUTION | ORAL | Status: DC | PRN

## 2021-06-10 MED ORDER — ACETAMINOPHEN 325 MG PO TABS: 325.0000 mg | ORAL_TABLET | Freq: Four times a day (QID) | ORAL | Status: DC | PRN

## 2021-06-10 MED ORDER — OXYCODONE HCL 5 MG PO TABS
10.0000 mg | ORAL_TABLET | ORAL | Status: DC | PRN
  Administered 2021-06-11: 15 mg via ORAL
  Filled 2021-06-10: qty 3

## 2021-06-10 MED ORDER — KETAMINE HCL 10 MG/ML IJ SOLN
0.3000 mg/kg | Freq: Once | INTRAMUSCULAR | Status: AC
  Administered 2021-06-10: 25 mg via INTRAVENOUS
  Filled 2021-06-10: qty 1

## 2021-06-10 MED ORDER — OXYCODONE HCL 5 MG PO TABS
5.0000 mg | ORAL_TABLET | ORAL | Status: DC | PRN
  Administered 2021-06-10: 10 mg via ORAL
  Filled 2021-06-10: qty 2

## 2021-06-10 NOTE — ED Provider Notes (Signed)
[facility] EMERGENCY DEPARTMENT Provider Note   CSN: [id number] Arrival date & time: 06/10/21  1940     History Chief Complaint  Patient presents with   Ankle Injury    Richard Ali is a 35 y.o. male.  Patient is a 35 year old male who presents with right ankle pain.  He was jumping at a trampoline park and he came down with his foot getting caught on the edge of the trampoline.  It twisted.  He has had constant throbbing pain and swelling to his right ankle since that time.  He did not hit his head.  There is no loss of consciousness.  He denies any neck or back pain.  He denies any other injuries.      History reviewed. No pertinent past medical history.  Patient Active Problem List   Diagnosis Date Noted   Ankle fracture 06/10/2021   Closed right pilon fracture, initial encounter 06/10/2021   ADHD 02/04/2020   Controlled substance agreement signed 02/04/2020    History reviewed. No pertinent surgical history.     Family History  Problem Relation Age of Onset   Multiple sclerosis Mother    Hypertension Father     Social History   Tobacco Use   Smoking status: Never   Smokeless tobacco: Never  Vaping Use   Vaping Use: Never used  Substance Use Topics   Alcohol use: No   Drug use: No    Home Medications Prior to Admission medications   Medication Sig Start Date End Date Taking? Authorizing Provider  amphetamine-dextroamphetamine (ADDERALL) 20 MG tablet Take 1 tablet (20 mg total) by mouth daily before breakfast. 03/10/21 04/09/21  Junie Spencer, FNP  amphetamine-dextroamphetamine (ADDERALL) 20 MG tablet Take 1 tablet (20 mg total) by mouth daily before breakfast. 05/08/21 06/07/21  Junie Spencer, FNP  amphetamine-dextroamphetamine (ADDERALL) 20 MG tablet Take 1 tablet (20 mg total) by mouth daily before breakfast. 04/06/21 05/06/21  Junie Spencer, FNP    Allergies    Bee venom  Review of Systems   Review of Systems  Constitutional:   Negative for fever.  Gastrointestinal:  Negative for nausea and vomiting.  Musculoskeletal:  Positive for arthralgias and joint swelling. Negative for back pain and neck pain.  Skin:  Negative for wound.  Neurological:  Negative for weakness, numbness and headaches.   Physical Exam Updated Vital Signs BP (!) 144/73 (BP Location: Right Arm)   Pulse 72   Temp 99.2 F (37.3 C) (Oral)   Resp 20   Ht 5\' 10"  (1.778 m)   Wt 83.9 kg   SpO2 98%   BMI 26.54 kg/m   Physical Exam Constitutional:      Appearance: He is well-developed.  HENT:     Head: Normocephalic and atraumatic.  Cardiovascular:     Rate and Rhythm: Normal rate.  Pulmonary:     Effort: Pulmonary effort is normal.  Musculoskeletal:        General: Tenderness present.     Cervical back: Normal range of motion and neck supple.     Comments: Positive swelling over the right ankle.  There is diffuse tenderness.  No pain to the foot.  No pain to the knee.  Pedal pulses are intact.  He has normal sensation and motor function distally.  Skin:    General: Skin is warm and dry.  Neurological:     Mental Status: He is alert and oriented to person, place, and time.    ED  Results / Procedures / Treatments   Labs (all labs ordered are listed, but only abnormal results are displayed) Labs Reviewed  BASIC METABOLIC PANEL - Abnormal; Notable for the following components:      Result Value   Potassium 3.4 (*)    Glucose, Bld 108 (*)    All other components within normal limits  CBC WITH DIFFERENTIAL/PLATELET - Abnormal; Notable for the following components:   WBC 20.0 (*)    Neutro Abs 17.3 (*)    Abs Immature Granulocytes 0.11 (*)    All other components within normal limits  RESP PANEL BY RT-PCR (FLU A&B, COVID) ARPGX2    EKG None  Radiology DG Ankle Complete Right  Result Date: 06/10/2021 CLINICAL DATA:  Right ankle injury EXAM: RIGHT ANKLE - COMPLETE 3+ VIEW COMPARISON:  None. FINDINGS: A comminuted intra-articular  fracture of the distal right tibia is identified with the major fracture plane extending obliquely through the a metaphyseal region of the distal right tibia with mild posterior angulation of the major distal fracture fragments. There are longitudinal fracture planes extending into the lateral aspect of the tibial plafond, best noted on AP examination. There is a a mildly comminuted oblique fracture through the distal right fibular metaphyseal region also identified extending to the level of the tibial plafond with 1/2 shaft with 1 shaft with medial displacement and mild posterior angulation of the distal fracture fragment. The tibiotalar joint space appears widened. There is extensive bimalleolar soft tissue swelling. IMPRESSION: Comminuted intra-articular fractures of the distal right fibula and tibia as described above. Electronically Signed   By: Helyn Numbers M.D.   On: 06/10/2021 20:53    Procedures Procedures   Medications Ordered in ED Medications  acetaminophen (TYLENOL) tablet 325-650 mg (has no administration in time range)  oxyCODONE (Oxy IR/ROXICODONE) immediate release tablet 5-10 mg (has no administration in time range)  oxyCODONE (Oxy IR/ROXICODONE) immediate release tablet 10-15 mg (has no administration in time range)  HYDROmorphone (DILAUDID) injection 1 mg (has no administration in time range)  methocarbamol (ROBAXIN) tablet 500 mg (has no administration in time range)    Or  methocarbamol (ROBAXIN) 500 mg in dextrose 5 % 50 mL IVPB (has no administration in time range)  diphenhydrAMINE (BENADRYL) 12.5 MG/5ML elixir 12.5-25 mg (has no administration in time range)  polyethylene glycol (MIRALAX / GLYCOLAX) packet 17 g (has no administration in time range)  ondansetron (ZOFRAN) tablet 4 mg (has no administration in time range)    Or  ondansetron (ZOFRAN) injection 4 mg (has no administration in time range)  morphine 4 MG/ML injection 4 mg (4 mg Intravenous Given 06/10/21 2104)   HYDROmorphone (DILAUDID) injection 1 mg (1 mg Intravenous Given 06/10/21 2134)    ED Course  I have reviewed the triage vital signs and the nursing notes.  Pertinent labs & imaging results that were available during my care of the patient were reviewed by me and considered in my medical decision making (see chart for details).    MDM Rules/Calculators/A&P                           Patient is a 35 year old male who presents with pain and swelling to his right ankle.  He has a distal tib-fib fracture.  I spoke with Dr. Jena Gauss who will admit the patient to [facility] for surgery tomorrow.  He will need to be n.p.o. after midnight.  His labs are reviewed.  His WBC  count is elevated at 20,000.  His temperature is 99.2.  He denies any recent illnesses.  No fevers.  No cough or cold symptoms.  No nausea or vomiting.  No pain anywhere other than his ankle.  We will continue to monitor for any signs of a fever but at this time there is no suggestions of infection.  His COVID test is pending. Final Clinical Impression(s) / ED Diagnoses Final diagnoses:  Closed fracture of right ankle, initial encounter    Rx / DC Orders ED Discharge Orders     None        Rolan Bucco, MD 06/10/21 2223

## 2021-06-10 NOTE — ED Triage Notes (Signed)
Pt injured right ankle on trampoline 855p-to triage in w/c

## 2021-06-10 NOTE — Progress Notes (Signed)
Ortho Trauma Note  Discussed case with Dr. Fredderick Phenix. 35 yo male with displaced distal tibia/pilon fracture. Recommend acute intervention with external fixation vs ORIF depending on swelling. Will admit to [facility] with surgery in the AM. NPO after midnight. Okay for a diet now. Discussed over the phone with patients wife. CT scan ordered by Dr. Fredderick Phenix.  Roby Lofts, MD [facility] [phone] (office) [email or web address]

## 2021-06-10 NOTE — ED Notes (Signed)
Report called to Cytogeneticist at [facility]

## 2021-06-11 ENCOUNTER — Encounter (HOSPITAL_COMMUNITY): Payer: Self-pay | Admitting: Student

## 2021-06-11 ENCOUNTER — Inpatient Hospital Stay (HOSPITAL_COMMUNITY): Payer: 59

## 2021-06-11 ENCOUNTER — Inpatient Hospital Stay (HOSPITAL_COMMUNITY): Payer: 59 | Admitting: Certified Registered Nurse Anesthetist

## 2021-06-11 ENCOUNTER — Encounter (HOSPITAL_COMMUNITY)
Admission: EM | Disposition: A | Discharge status: Home / Self Care | Payer: Self-pay | Source: Home / Self Care | Attending: Student

## 2021-06-11 DIAGNOSIS — Z820 Family history of epilepsy and other diseases of the nervous system: Secondary | ICD-10-CM | POA: Diagnosis not present

## 2021-06-11 DIAGNOSIS — Z8249 Family history of ischemic heart disease and other diseases of the circulatory system: Secondary | ICD-10-CM | POA: Diagnosis not present

## 2021-06-11 DIAGNOSIS — Y9344 Activity, trampolining: Secondary | ICD-10-CM | POA: Diagnosis not present

## 2021-06-11 DIAGNOSIS — S82831A Other fracture of upper and lower end of right fibula, initial encounter for closed fracture: Secondary | ICD-10-CM | POA: Diagnosis present

## 2021-06-11 DIAGNOSIS — Z79899 Other long term (current) drug therapy: Secondary | ICD-10-CM | POA: Diagnosis not present

## 2021-06-11 DIAGNOSIS — Z20822 Contact with and (suspected) exposure to covid-19: Secondary | ICD-10-CM | POA: Diagnosis present

## 2021-06-11 DIAGNOSIS — Z91030 Bee allergy status: Secondary | ICD-10-CM | POA: Diagnosis not present

## 2021-06-11 DIAGNOSIS — S82899A Other fracture of unspecified lower leg, initial encounter for closed fracture: Secondary | ICD-10-CM | POA: Diagnosis present

## 2021-06-11 DIAGNOSIS — S82871A Displaced pilon fracture of right tibia, initial encounter for closed fracture: Secondary | ICD-10-CM | POA: Diagnosis present

## 2021-06-11 DIAGNOSIS — Y92838 Other recreation area as the place of occurrence of the external cause: Secondary | ICD-10-CM | POA: Diagnosis not present

## 2021-06-11 DIAGNOSIS — F909 Attention-deficit hyperactivity disorder, unspecified type: Secondary | ICD-10-CM | POA: Diagnosis present

## 2021-06-11 HISTORY — PX: ORIF ANKLE FRACTURE: SHX5408

## 2021-06-11 LAB — CBC
HCT: 40.4 % (ref 39.0–52.0)
Hemoglobin: 14 g/dL (ref 13.0–17.0)
MCH: 29.9 pg (ref 26.0–34.0)
MCHC: 34.7 g/dL (ref 30.0–36.0)
MCV: 86.3 fL (ref 80.0–100.0)
Platelets: 280 10*3/uL (ref 150–400)
RBC: 4.68 MIL/uL (ref 4.22–5.81)
RDW: 12.8 % (ref 11.5–15.5)
WBC: 13.9 10*3/uL — ABNORMAL HIGH (ref 4.0–10.5)
nRBC: 0 % (ref 0.0–0.2)

## 2021-06-11 LAB — SURGICAL PCR SCREEN
MRSA, PCR: NEGATIVE
Staphylococcus aureus: NEGATIVE

## 2021-06-11 LAB — VITAMIN D 25 HYDROXY (VIT D DEFICIENCY, FRACTURES): Vit D, 25-Hydroxy: 28.14 ng/mL — ABNORMAL LOW (ref 30–100)

## 2021-06-11 LAB — CREATININE, SERUM
Creatinine, Ser: 1.11 mg/dL (ref 0.61–1.24)
GFR, Estimated: >60 mL/min (ref >60)

## 2021-06-11 SURGERY — OPEN REDUCTION INTERNAL FIXATION (ORIF) ANKLE FRACTURE
Anesthesia: General | Site: Ankle | Laterality: Right

## 2021-06-11 MED ORDER — FENTANYL CITRATE (PF) 100 MCG/2ML IJ SOLN
INTRAMUSCULAR | Status: DC | PRN
  Administered 2021-06-11 (×2): 50 ug via INTRAVENOUS

## 2021-06-11 MED ORDER — ONDANSETRON HCL 4 MG/2ML IJ SOLN
INTRAMUSCULAR | Status: DC | PRN
  Administered 2021-06-11: 4 mg via INTRAVENOUS

## 2021-06-11 MED ORDER — ONDANSETRON HCL 4 MG PO TABS: 4.0000 mg | ORAL_TABLET | Freq: Four times a day (QID) | ORAL | 0 refills | Status: AC | PRN

## 2021-06-11 MED ORDER — METOCLOPRAMIDE HCL 5 MG PO TABS: 5.0000 mg | ORAL_TABLET | Freq: Three times a day (TID) | ORAL | Status: DC | PRN

## 2021-06-11 MED ORDER — PROPOFOL 10 MG/ML IV BOLUS
INTRAVENOUS | Status: AC
  Filled 2021-06-11: qty 40

## 2021-06-11 MED ORDER — POTASSIUM CHLORIDE IN NACL 20-0.9 MEQ/L-% IV SOLN
INTRAVENOUS | Status: DC
  Filled 2021-06-11: qty 1000

## 2021-06-11 MED ORDER — PHENYLEPHRINE 40 MCG/ML (10ML) SYRINGE FOR IV PUSH (FOR BLOOD PRESSURE SUPPORT)
PREFILLED_SYRINGE | INTRAVENOUS | Status: AC
  Filled 2021-06-11: qty 10

## 2021-06-11 MED ORDER — LACTATED RINGERS IV SOLN: INTRAVENOUS | Status: DC

## 2021-06-11 MED ORDER — ONDANSETRON HCL 4 MG/2ML IJ SOLN: 4.0000 mg | Freq: Four times a day (QID) | INTRAMUSCULAR | Status: DC | PRN

## 2021-06-11 MED ORDER — CEFAZOLIN SODIUM-DEXTROSE 2-4 GM/100ML-% IV SOLN
INTRAVENOUS | Status: AC
  Filled 2021-06-11: qty 100

## 2021-06-11 MED ORDER — VANCOMYCIN HCL 1000 MG IV SOLR
INTRAVENOUS | Status: DC | PRN
  Administered 2021-06-11: 1000 mg via TOPICAL

## 2021-06-11 MED ORDER — CEFAZOLIN SODIUM-DEXTROSE 2-4 GM/100ML-% IV SOLN
2.0000 g | Freq: Three times a day (TID) | INTRAVENOUS | Status: DC
  Administered 2021-06-11: 2 g via INTRAVENOUS
  Filled 2021-06-11 (×4): qty 100

## 2021-06-11 MED ORDER — METHOCARBAMOL 500 MG PO TABS: 500.0000 mg | ORAL_TABLET | Freq: Four times a day (QID) | ORAL | 0 refills | Status: AC | PRN

## 2021-06-11 MED ORDER — DEXAMETHASONE SODIUM PHOSPHATE 10 MG/ML IJ SOLN
INTRAMUSCULAR | Status: DC | PRN
  Administered 2021-06-11: 8 mg via INTRAVENOUS

## 2021-06-11 MED ORDER — FENTANYL CITRATE (PF) 250 MCG/5ML IJ SOLN
INTRAMUSCULAR | Status: AC
  Filled 2021-06-11: qty 5

## 2021-06-11 MED ORDER — CHLORHEXIDINE GLUCONATE 0.12 % MT SOLN
OROMUCOSAL | Status: AC
  Administered 2021-06-11: 15 mL via OROMUCOSAL
  Filled 2021-06-11: qty 15

## 2021-06-11 MED ORDER — ASPIRIN EC 325 MG PO TBEC: 325.0000 mg | DELAYED_RELEASE_TABLET | Freq: Two times a day (BID) | ORAL | 0 refills | Status: AC

## 2021-06-11 MED ORDER — CHLORHEXIDINE GLUCONATE 0.12 % MT SOLN: 15.0000 mL | Freq: Once | OROMUCOSAL | Status: AC

## 2021-06-11 MED ORDER — OXYCODONE-ACETAMINOPHEN 5-325 MG PO TABS: 1.0000 | ORAL_TABLET | ORAL | 0 refills | Status: AC | PRN

## 2021-06-11 MED ORDER — LIDOCAINE 2% (20 MG/ML) 5 ML SYRINGE
INTRAMUSCULAR | Status: DC | PRN
  Administered 2021-06-11: 60 mg via INTRAVENOUS

## 2021-06-11 MED ORDER — MIDAZOLAM HCL 2 MG/2ML IJ SOLN
INTRAMUSCULAR | Status: DC | PRN
  Administered 2021-06-11: 2 mg via INTRAVENOUS

## 2021-06-11 MED ORDER — ONDANSETRON HCL 4 MG PO TABS: 4.0000 mg | ORAL_TABLET | Freq: Four times a day (QID) | ORAL | Status: DC | PRN

## 2021-06-11 MED ORDER — ACETAMINOPHEN 325 MG PO TABS
650.0000 mg | ORAL_TABLET | Freq: Four times a day (QID) | ORAL | Status: DC
  Administered 2021-06-11: 650 mg via ORAL
  Filled 2021-06-11: qty 2

## 2021-06-11 MED ORDER — PHENYLEPHRINE 40 MCG/ML (10ML) SYRINGE FOR IV PUSH (FOR BLOOD PRESSURE SUPPORT)
PREFILLED_SYRINGE | INTRAVENOUS | Status: DC | PRN
  Administered 2021-06-11: 40 ug via INTRAVENOUS

## 2021-06-11 MED ORDER — MIDAZOLAM HCL 2 MG/2ML IJ SOLN
INTRAMUSCULAR | Status: AC
  Filled 2021-06-11: qty 2

## 2021-06-11 MED ORDER — ORAL CARE MOUTH RINSE: 15.0000 mL | Freq: Once | OROMUCOSAL | Status: AC

## 2021-06-11 MED ORDER — VANCOMYCIN HCL 1000 MG IV SOLR
INTRAVENOUS | Status: AC
  Filled 2021-06-11: qty 20

## 2021-06-11 MED ORDER — BUPIVACAINE-EPINEPHRINE (PF) 0.5% -1:200000 IJ SOLN
INTRAMUSCULAR | Status: DC | PRN
  Administered 2021-06-11: 15 mL via PERINEURAL
  Administered 2021-06-11: 30 mL via PERINEURAL

## 2021-06-11 MED ORDER — 0.9 % SODIUM CHLORIDE (POUR BTL) OPTIME
TOPICAL | Status: DC | PRN
  Administered 2021-06-11: 1000 mL

## 2021-06-11 MED ORDER — PROPOFOL 10 MG/ML IV BOLUS
INTRAVENOUS | Status: DC | PRN
  Administered 2021-06-11: 200 mg via INTRAVENOUS

## 2021-06-11 MED ORDER — ENOXAPARIN SODIUM 40 MG/0.4ML IJ SOSY: 40.0000 mg | PREFILLED_SYRINGE | INTRAMUSCULAR | Status: DC

## 2021-06-11 MED ORDER — DOCUSATE SODIUM 100 MG PO CAPS
100.0000 mg | ORAL_CAPSULE | Freq: Two times a day (BID) | ORAL | Status: DC
  Administered 2021-06-11: 100 mg via ORAL
  Filled 2021-06-11: qty 1

## 2021-06-11 MED ORDER — METOCLOPRAMIDE HCL 5 MG/ML IJ SOLN: 5.0000 mg | Freq: Three times a day (TID) | INTRAMUSCULAR | Status: DC | PRN

## 2021-06-11 SURGICAL SUPPLY — 69 items
BAG COUNTER SPONGE SURGICOUNT (BAG) ×3 IMPLANT
BIT DRILL 2.5X2.75 QC CALB (BIT) ×3 IMPLANT
BIT DRILL CALIBRATED 2.7 (BIT) ×6 IMPLANT
BNDG COHESIVE 4X5 TAN STRL (GAUZE/BANDAGES/DRESSINGS) ×3 IMPLANT
BNDG ELASTIC 4X5.8 VLCR STR LF (GAUZE/BANDAGES/DRESSINGS) ×3 IMPLANT
BNDG ELASTIC 6X5.8 VLCR STR LF (GAUZE/BANDAGES/DRESSINGS) ×3 IMPLANT
BNDG GAUZE ELAST 4 BULKY (GAUZE/BANDAGES/DRESSINGS) ×6 IMPLANT
BRUSH SCRUB EZ PLAIN DRY (MISCELLANEOUS) ×6 IMPLANT
CHLORAPREP W/TINT 26 (MISCELLANEOUS) ×3 IMPLANT
COVER SURGICAL LIGHT HANDLE (MISCELLANEOUS) ×6 IMPLANT
DRAPE C-ARM 42X72 X-RAY (DRAPES) ×3 IMPLANT
DRAPE C-ARMOR (DRAPES) ×3 IMPLANT
DRAPE IMP U-DRAPE 54X76 (DRAPES) ×6 IMPLANT
DRAPE ORTHO SPLIT 77X108 STRL (DRAPES) ×2
DRAPE SURG ORHT 6 SPLT 77X108 (DRAPES) ×4 IMPLANT
DRAPE U-SHAPE 47X51 STRL (DRAPES) ×3 IMPLANT
DRSG MEPITEL 4X7.2 (GAUZE/BANDAGES/DRESSINGS) ×3 IMPLANT
DRSG PAD ABDOMINAL 8X10 ST (GAUZE/BANDAGES/DRESSINGS) ×6 IMPLANT
ELECT REM PT RETURN 9FT ADLT (ELECTROSURGICAL) ×3
ELECTRODE REM PT RTRN 9FT ADLT (ELECTROSURGICAL) ×2 IMPLANT
GAUZE SPONGE 4X4 12PLY STRL (GAUZE/BANDAGES/DRESSINGS) ×6 IMPLANT
GLOVE SURG ENC MOIS LTX SZ6.5 (GLOVE) ×9 IMPLANT
GLOVE SURG ENC MOIS LTX SZ7.5 (GLOVE) ×12 IMPLANT
GLOVE SURG UNDER POLY LF SZ6.5 (GLOVE) ×3 IMPLANT
GLOVE SURG UNDER POLY LF SZ7.5 (GLOVE) ×3 IMPLANT
GOWN STRL REUS W/ TWL LRG LVL3 (GOWN DISPOSABLE) ×4 IMPLANT
GOWN STRL REUS W/TWL LRG LVL3 (GOWN DISPOSABLE) ×6
K-WIRE ACE 1.6X6 (WIRE) ×18
KIT BASIN OR (CUSTOM PROCEDURE TRAY) ×3 IMPLANT
KIT TURNOVER KIT B (KITS) ×3 IMPLANT
KWIRE ACE 1.6X6 (WIRE) ×12 IMPLANT
MANIFOLD NEPTUNE II (INSTRUMENTS) ×3 IMPLANT
NEEDLE 22X1 1/2 (OR ONLY) (NEEDLE) IMPLANT
NS IRRIG 1000ML POUR BTL (IV SOLUTION) ×3 IMPLANT
PACK ORTHO EXTREMITY (CUSTOM PROCEDURE TRAY) ×3 IMPLANT
PAD ARMBOARD 7.5X6 YLW CONV (MISCELLANEOUS) ×6 IMPLANT
PAD CAST 4YDX4 CTTN HI CHSV (CAST SUPPLIES) ×2 IMPLANT
PADDING CAST COTTON 4X4 STRL (CAST SUPPLIES) ×2
PADDING CAST COTTON 6X4 STRL (CAST SUPPLIES) ×3 IMPLANT
PLATE LOCK 4H 109 RT DIST FIB (Plate) ×3 IMPLANT
PLATE LOCK 6H RT MED DIST TIB (Plate) ×3 IMPLANT
SCREW CORT LP 3.5X12 (Screw) ×9 IMPLANT
SCREW CORTICAL 3.5MM  44MM (Screw) ×3 IMPLANT
SCREW CORTICAL 3.5MM  46MM (Screw) ×3 IMPLANT
SCREW CORTICAL 3.5MM 44MM (Screw) ×2 IMPLANT
SCREW CORTICAL 3.5MM 46MM (Screw) ×2 IMPLANT
SCREW CORTICAL LOW PROF 3.5X32 (Screw) ×3 IMPLANT
SCREW LOCK CORT STAR 3.5X16 (Screw) ×12 IMPLANT
SCREW LOCK CORT STAR 3.5X42 (Screw) ×3 IMPLANT
SCREW LOCK CORT STAR 3.5X44 (Screw) ×3 IMPLANT
SCREW LOW PROF TIS 3.5X28MM (Screw) ×3 IMPLANT
SCREW LP NL T15 3.5X26 (Screw) ×6 IMPLANT
SCREW T15 LP CORT 3.5X38MM NS (Screw) ×3 IMPLANT
SCREW T15 MD 3.5X46MM NS (Screw) ×3 IMPLANT
SCREW TIS LP 3.5X18 NS (Screw) ×3 IMPLANT
SPLINT PLASTER CAST XFAST 5X30 (CAST SUPPLIES) ×2 IMPLANT
SPLINT PLASTER XFAST SET 5X30 (CAST SUPPLIES) ×1
SPONGE T-LAP 18X18 ~~LOC~~+RFID (SPONGE) ×3 IMPLANT
STAPLER VISISTAT 35W (STAPLE) ×3 IMPLANT
STRIP CLOSURE SKIN 1/2X4 (GAUZE/BANDAGES/DRESSINGS) IMPLANT
SUCTION FRAZIER HANDLE 10FR (MISCELLANEOUS) ×3
SUCTION TUBE FRAZIER 10FR DISP (MISCELLANEOUS) ×2 IMPLANT
SUT ETHILON 3 0 PS 1 (SUTURE) ×9 IMPLANT
SUT PROLENE 0 CT (SUTURE) IMPLANT
SUT VIC AB 2-0 CT1 27 (SUTURE) ×6
SUT VIC AB 2-0 CT1 TAPERPNT 27 (SUTURE) ×4 IMPLANT
TOWEL GREEN STERILE (TOWEL DISPOSABLE) ×6 IMPLANT
TOWEL GREEN STERILE FF (TOWEL DISPOSABLE) ×6 IMPLANT
UNDERPAD 30X36 HEAVY ABSORB (UNDERPADS AND DIAPERS) ×3 IMPLANT

## 2021-06-11 NOTE — Progress Notes (Signed)
OT Cancellation Note  Patient Details Name: Richard Ali MRN: [id number] DOB: 1986-05-12   Cancelled Treatment:    Reason Eval/Treat Not Completed: OT screened, no needs identified, will sign off (Pt is completing all ADLs at mod I level, has good family support for d/c. No OT needs identified at this time, signing off.)  Lelon Mast A Madilyne Tadlock 06/11/2021, 1:24 PM

## 2021-06-11 NOTE — Anesthesia Preprocedure Evaluation (Signed)
Anesthesia Evaluation  Patient identified by MRN, date of birth, ID band Patient awake    Reviewed: Allergy & Precautions, NPO status , Patient's Chart, lab work & pertinent test results  Airway Mallampati: I  TM Distance: >3 FB Neck ROM: Full    Dental  (+) Teeth Intact, Chipped,    Pulmonary neg pulmonary ROS,    breath sounds clear to auscultation       Cardiovascular negative cardio ROS   Rhythm:Regular Rate:Normal     Neuro/Psych PSYCHIATRIC DISORDERS negative neurological ROS     GI/Hepatic negative GI ROS, Neg liver ROS,   Endo/Other  negative endocrine ROS  Renal/GU negative Renal ROS     Musculoskeletal negative musculoskeletal ROS (+)   Abdominal Normal abdominal exam  (+)   Peds  Hematology negative hematology ROS (+)   Anesthesia Other Findings   Reproductive/Obstetrics                             Anesthesia Physical Anesthesia Plan  ASA: 2  Anesthesia Plan: General   Post-op Pain Management:    Induction: Intravenous  PONV Risk Score and Plan: 3 and Ondansetron, Dexamethasone and Midazolam  Airway Management Planned: LMA  Additional Equipment: None  Intra-op Plan:   Post-operative Plan: Extubation in OR  Informed Consent: I have reviewed the patients History and Physical, chart, labs and discussed the procedure including the risks, benefits and alternatives for the proposed anesthesia with the patient or authorized representative who has indicated his/her understanding and acceptance.     Dental advisory given  Plan Discussed with: CRNA  Anesthesia Plan Comments: (Consented for block in PACU if ORIF completed. )        Anesthesia Quick Evaluation

## 2021-06-11 NOTE — Plan of Care (Signed)
  Problem: Health Behavior/Discharge Planning: Goal: Ability to manage health-related needs will improve Outcome: Progressing   Problem: Clinical Measurements: Goal: Ability to maintain clinical measurements within normal limits will improve Outcome: Progressing   Problem: Elimination: Goal: Will not experience complications related to bowel motility Outcome: Progressing   Problem: Pain Managment: Goal: General experience of comfort will improve Outcome: Progressing   Problem: Safety: Goal: Ability to remain free from injury will improve Outcome: Progressing   Problem: Skin Integrity: Goal: Risk for impaired skin integrity will decrease Outcome: Progressing

## 2021-06-11 NOTE — Progress Notes (Signed)
Dr. Jena Gauss notified of patient arrival to unit 5 N bed 06.

## 2021-06-11 NOTE — ED Notes (Signed)
Pt. On monitor. 

## 2021-06-11 NOTE — Progress Notes (Signed)
Provided discharge education/instructions, all questions and concerns addressed, Pt denies pain, not in acute distress, discharged home with belongings accompanied by wife.

## 2021-06-11 NOTE — Op Note (Signed)
Orthopaedic Surgery Operative Note (CSN: [id number] ) Date of Surgery: 06/11/2021  Admit Date: 06/10/2021   Diagnoses: Pre-Op Diagnoses: Right distal tibia/fibula fracture  Post-Op Diagnosis: Same  Procedures: CPT 27828-Open reduction internal fixation of right pilon fracture (tibia and fibula)  Surgeons : Primary: Roby Lofts, MD  Assistant: Ulyses Southward, PA-C  Location: OR 6   Anesthesia:General   Antibiotics: Ancef 2g preop with 1 gm vancomycin powder placed topically   Tourniquet time:None used    Estimated Blood Loss:50 mL  Complications:None  Specimens:None   Implants: Implant Name Type Inv. Item Serial No. Manufacturer Lot No. LRB No. Used Action  SCREW CORTICAL 3.5MM  - [id number] Screw SCREW CORTICAL 3.5MM   [employer](ORTH,TRAU,BIO,SG)  Right 1 Implanted  SCREW CORT LP 3.5X12 - [id number] Screw SCREW CORT LP 3.5X12  [employer](ORTH,TRAU,BIO,SG)  Right 3 Implanted  SCREW TIS LP 3.5X18 NS - [id number] Screw SCREW TIS LP 3.5X18 NS  [employer](ORTH,TRAU,BIO,SG)  Right 1 Implanted  SCREW LOCK CORT STAR 3.5X16 - [id number] Screw SCREW LOCK CORT STAR 3.5X16  [employer](ORTH,TRAU,BIO,SG)  Right 4 Implanted  SCREW CORTICAL LOW PROF 3.5X32 - [id number] Screw SCREW CORTICAL LOW PROF 3.5X32  [employer](ORTH,TRAU,BIO,SG)  Right 1 Implanted  SCREW T15 LP CORT 3.5X38MM NS - [id number] Screw SCREW T15 LP CORT 3.5X38MM NS  [employer](ORTH,TRAU,BIO,SG)  Right 1 Implanted  PLATE LOCK 6H RT MED DIST TIB - [id number] Plate PLATE LOCK 6H RT MED DIST TIB  [employer](ORTH,TRAU,BIO,SG)  Right 1 Implanted  SCREW LP NL T15 3.5X26 - [id number] Screw SCREW LP NL T15 3.5X26  [employer](ORTH,TRAU,BIO,SG)  Right 2 Implanted  SCREW LOCK CORT STAR 3.5X44 - [id number] Screw SCREW LOCK CORT STAR 3.5X44  [employer](ORTH,TRAU,BIO,SG)  Right 1 Implanted  SCREW LOCK CORT STAR 3.5X42 - [id number] Screw SCREW LOCK CORT STAR 3.5X42  [employer](ORTH,TRAU,BIO,SG)  Right 1 Implanted   SCREW T15 MD 3.5X46MM NS - [id number] Screw SCREW T15 MD 3.5X46MM NS  [employer](ORTH,TRAU,BIO,SG)  Right 1 Implanted  SCREW CORTICAL 3.5MM  - [id number] Screw SCREW CORTICAL 3.5MM   [employer](ORTH,TRAU,BIO,SG)  Right 1 Implanted  PLATE LOCK 4H 734 RT DIST FIB - [id number] Plate PLATE LOCK 4H 973 RT DIST FIB  [employer](ORTH,TRAU,BIO,SG)  Right 1 Implanted     Indications for Surgery: 35 year old male who injured his right ankle trampoline park.  He was found to have a distal tibia and fibula fracture with intra-articular extension.  Due to the unstable nature of his injury I recommend proceeding to the operating room for possible external fixation and closed reduction versus open reduction internal fixation based on swelling.  Risks and benefits were discussed with the patient and his wife.  Risks included but not limited to bleeding, infection, malunion, nonunion, hardware failure, hardware rotation, nerve or blood vessel injury, DVT, and the possible anesthetic complications.  They agreed to proceed with surgery and consent was obtained.  Operative Findings: 1.  Open reduction internal fixation of right distal fibula using [employer] ALPS anatomic distal fibular locking plate 2.  Open reduction internal fixation of right distal tibia fracture using [employer] ALPS medial distal tibial locking plate. 3.  Independent 3.5 millimeter screws from anterior to posterior for fixation of the posterior malleolus fracture  Procedure: The patient was identified in the preoperative holding area. Consent was confirmed with the patient and their family and all questions were answered. The operative extremity was marked after confirmation with the patient. he was then brought back  to the operating room by our anesthesia colleagues.  He was placed under general anesthetic and carefully transferred over to a radiolucent flat top table.  A bump was placed under his operative hip.  The right lower  extremity was prepped and draped in usual sterile fashion.  A timeout was performed to verify patient, the procedure, and the extremity.  Preoperative antibiotics were dosed.  Is wrinkling was appropriate for surgical incision as result I felt that proceeding with open reduction internal fixation was most appropriate.  Fluoroscopic imaging was obtained.  I then made a direct lateral approach to the distal fibula.  I carried it down through skin and subcutaneous tissue.  I carefully protected the superficial peroneal nerve with my dissection.  I visualized the oblique fracture of the distal fibula.  I cleaned out the hematoma and used a reduction tenaculum to anatomically reduce this.  After reduction of the distal fibula at the posterior malleolus fragment was improved alignment but still is not anatomic Result I used a reduction tenaculum to go posterior to the fibula and grasped the posterior malleolus and made a percutaneous incision along the anterior lateral aspect of the distal tibia to compress and reduce the fragment.  I held provisionally with a K wire.  Once I was happy with the reduction of the posterior malleous I then proceeded to place a anterior posterior 3.5 millimeter screw through the percutaneous incision.  I obtained excellent fixation and was able to remove the clamp.  I then provisionally fixed the distal fibula fracture with a 1.6 mm K wires and I was able to remove the reduction tenaculum.  I then contoured a [employer] ALPS anatomic distal fibular plate and held provisionally with a K wire.  I then drilled and placed a nonlocking screw into the shaft of the fibula.  I then placed a nonlocking screw into the distal metaphysis of the fibula.  I then proceeded to place locking screws in the distal fibula and return to the proximal segment to place 2 more nonlocking screws.  I then made a anterior medial incision and carried it down through skin and subcutaneous tissue.  I dissected out  the neurovascular bundle and protected this throughout the case.  I then proceeded to percutaneously place a time for a reduction tenaculum along the posterior lateral aspect of the distal tibia.  I was able to then clamp the oblique distal tibia fracture into a near anatomic position.  I then proceeded to drill and place a low-profile 3.5 millimeter screw to provisionally hold the metaphyseal reduction.  Once I had a provisional reduction I removed the clamp and then used a [employer] ALPS distal medial tibial locking plate.  I held provisionally with a K wire and then proceeded to place a nonlocking screw into the metaphysis to bring the plate flush to bone.  I then percutaneously placed a 3 nonlocking screws into the tibial shaft to bring the proximal portion of plate flush to bone.  I then proceeded to place locking screws into the distal articular segment.  I then proceeded to remove the K wire for the posterior malleolus fixation and placed another anterior to posterior nonlocking 3.5 millimeter screw to reinforce the posterior malleolus fixation.  Final fluoroscopic imaging was then obtained.  The incisions were copiously irrigated.  A gram of vancomycin powder was placed between the 2 incisions.  A layer closure of 2-0 Vicryl and 3-0 nylon was used to close the skin.  Sterile dressing was  applied.  A well-padded short leg splint was applied.  The patient was then awoken from anesthesia and taken to the PACU in stable condition.  Post Op Plan/Instructions: The patient will be nonweightbearing to the right lower extremity.  He will be placed on aspirin 325 mg for DVT prophylaxis.  He will likely be discharged home in the next 24 hours.  We we will plan to see him back in 2 weeks for x-rays and suture removal with transition to a boot likely.  I was present and performed the entire surgery.  Ulyses Southward, PA-C did assist me throughout the case. An assistant was necessary given the difficulty in  approach, maintenance of reduction and ability to instrument the fracture.   Truitt Merle, MD [facility]

## 2021-06-11 NOTE — Transfer of Care (Signed)
Immediate Anesthesia Transfer of Care Note  Patient: Richard Ali  Procedure(s) Performed: ORIF PILON (Right)  Patient Location: PACU  Anesthesia Type:General  Level of Consciousness: sedated  Airway & Oxygen Therapy: Patient Spontanous Breathing, Patient connected to face mask oxygen and oral airway  Post-op Assessment: Report given to RN and Post -op Vital signs reviewed and stable  Post vital signs: Reviewed and stable  Last Vitals:  Vitals Value Taken Time  BP 142/83 06/11/21 0944  Temp    Pulse 63 06/11/21 0946  Resp 5 06/11/21 0946  SpO2 100 % 06/11/21 0946  Vitals shown include unvalidated device data.  Last Pain:  Vitals:   06/11/21 0706  TempSrc: Oral  PainSc: 8       Patients Stated Pain Goal: 3 (06/11/21 0358)  Complications: No notable events documented.

## 2021-06-11 NOTE — Anesthesia Postprocedure Evaluation (Signed)
Anesthesia Post Note  Patient: Richard Ali  Procedure(s) Performed: ORIF PILON (Right)     Patient location during evaluation: PACU Anesthesia Type: General Level of consciousness: awake and alert Pain management: pain level controlled Vital Signs Assessment: post-procedure vital signs reviewed and stable Respiratory status: spontaneous breathing, nonlabored ventilation, respiratory function stable and patient connected to nasal cannula oxygen Cardiovascular status: blood pressure returned to baseline and stable Postop Assessment: no apparent nausea or vomiting Anesthetic complications: no   No notable events documented.  Last Vitals:  Vitals:   06/11/21 1045 06/11/21 1104  BP: 122/65 136/69  Pulse: 89 96  Resp: 12 16  Temp: 36.7 C 37.1 C  SpO2: 96% 98%    Last Pain:  Vitals:   06/11/21 1104  TempSrc: Oral  PainSc: 0-No pain                 Shelton Silvas

## 2021-06-11 NOTE — ED Notes (Signed)
[facility] on sight to transport.

## 2021-06-11 NOTE — ED Notes (Signed)
Report given to [facility]. 

## 2021-06-11 NOTE — H&P (Signed)
[facility] (OTS) Consult   Patient ID: Richard Ali MRN: [id number] DOB/AGE: 33-Apr-1987 34 y.o.  Reason for Consult:Right pilon fracture Referring Physician: Dr. Rolan Bucco, MD [facility]  HPI: Richard Ali is an 35 y.o. male seen in consultation at the request of Dr. Fredderick Phenix for evaluation of right pilon fracture.  Patient was at a trampoline park where he injured his ankle.  Had immediate pain and deformity inability bear weight.  He was brought to the [facility] emergency room where x-rays showed a displaced distal tibia and fibula fracture.  CT scan was obtained.  Was asked to consult due to the complexity of his injury.  Patient was seen and evaluated in preoperative holding area.  Patient otherwise healthy.  Denies any major medical problems.  Patient does not smoke he does not drink.  He denies any numbness or tingling.  He does have pain with attempted motion of his toes and digits.  Past Medical History:  Diagnosis Date   ADHD (attention deficit hyperactivity disorder)     Past Surgical History:  Procedure Laterality Date   APPENDECTOMY     WISDOM TOOTH EXTRACTION      Family History  Problem Relation Age of Onset   Multiple sclerosis Mother    Hypertension Father     Social History:  reports that he has never smoked. He has never used smokeless tobacco. He reports that he does not currently use alcohol. He reports that he does not use drugs.  Allergies:  Allergies  Allergen Reactions   Bee Venom     Medications:  No current facility-administered medications on file prior to encounter.   Current Outpatient Medications on File Prior to Encounter  Medication Sig Dispense Refill   amphetamine-dextroamphetamine (ADDERALL) 20 MG tablet Take 1 tablet (20 mg total) by mouth daily before breakfast. 30 tablet 0   amphetamine-dextroamphetamine (ADDERALL) 20 MG tablet Take 1 tablet (20 mg total) by mouth daily before  breakfast. 30 tablet 0   amphetamine-dextroamphetamine (ADDERALL) 20 MG tablet Take 1 tablet (20 mg total) by mouth daily before breakfast. 30 tablet 0     ROS: Constitutional: No fever or chills Vision: No changes in vision ENT: No difficulty swallowing CV: No chest pain Pulm: No SOB or wheezing GI: No nausea or vomiting GU: No urgency or inability to hold urine Skin: No poor wound healing Neurologic: No numbness or tingling Psychiatric: No depression or anxiety Heme: No bruising Allergic: No reaction to medications or food   Exam: Blood pressure (!) 158/95, pulse 70, temperature 97.8 F (36.6 C), temperature source Oral, resp. rate 20, height 5\' 10"  (1.778 m), weight 83.9 kg, SpO2 98 %. General: No acute distress Orientation: Awake alert and oriented x3 Mood and Affect: Cooperative and pleasant Gait: Unable to assess due to his fracture Coordination and balance: Within normal limits   Right lower extremity: Splint is in place is clean dry and intact.  Compartments are soft compressible.  Patient is able able to wiggle his toes.  He has sensation intact to light touch.  He has a warm well-perfused foot with brisk cap refill  Left lower extremity: Skin without lesions. No tenderness to palpation. Full painless ROM, full strength in each muscle groups without evidence of instability.   Medical Decision Making: Data: Imaging: X-rays and CT scan of the right ankle are reviewed which shows an intra-articular distal tibia fracture with distal fibula fracture with associated talus dislocation posteriorly.  Labs:  Results for orders placed or performed during the hospital encounter of 06/10/21 (from the past 24 hour(s))  Basic metabolic panel     Status: Abnormal   Collection Time: 06/10/21  9:42 PM  Result Value Ref Range   Sodium 139 135 - 145 mmol/L   Potassium 3.4 (L) 3.5 - 5.1 mmol/L   Chloride 105 98 - 111 mmol/L   CO2 24 22 - 32 mmol/L   Glucose, Bld 108 (H) 70 - 99 mg/dL    BUN 10 6 - 20 mg/dL   Creatinine, Ser 0.34 0.61 - 1.24 mg/dL   Calcium 9.2 8.9 - 74.2 mg/dL   GFR, Estimated >59 >56 mL/min   Anion gap 10 5 - 15  CBC with Differential     Status: Abnormal   Collection Time: 06/10/21  9:42 PM  Result Value Ref Range   WBC 20.0 (H) 4.0 - 10.5 K/uL   RBC 5.01 4.22 - 5.81 MIL/uL   Hemoglobin 15.3 13.0 - 17.0 g/dL   HCT 38.7 56.4 - 33.2 %   MCV 85.0 80.0 - 100.0 fL   MCH 30.5 26.0 - 34.0 pg   MCHC 35.9 30.0 - 36.0 g/dL   RDW 95.1 88.4 - 16.6 %   Platelets 286 150 - 400 K/uL   nRBC 0.0 0.0 - 0.2 %   Neutrophils Relative % 86 %   Neutro Abs 17.3 (H) 1.7 - 7.7 K/uL   Lymphocytes Relative 8 %   Lymphs Abs 1.5 0.7 - 4.0 K/uL   Monocytes Relative 5 %   Monocytes Absolute 1.0 0.1 - 1.0 K/uL   Eosinophils Relative 0 %   Eosinophils Absolute 0.0 0.0 - 0.5 K/uL   Basophils Relative 0 %   Basophils Absolute 0.1 0.0 - 0.1 K/uL   Immature Granulocytes 1 %   Abs Immature Granulocytes 0.11 (H) 0.00 - 0.07 K/uL  Resp Panel by RT-PCR (Flu A&B, Covid) Nasopharyngeal Swab     Status: None   Collection Time: 06/10/21  9:42 PM   Specimen: Nasopharyngeal Swab; Nasopharyngeal(NP) swabs in vial transport medium  Result Value Ref Range   SARS Coronavirus 2 by RT PCR NEGATIVE NEGATIVE   Influenza A by PCR NEGATIVE NEGATIVE   Influenza B by PCR NEGATIVE NEGATIVE  Surgical pcr screen     Status: None   Collection Time: 06/11/21  1:54 AM   Specimen: Nasal Mucosa; Nasal Swab  Result Value Ref Range   MRSA, PCR NEGATIVE NEGATIVE   Staphylococcus aureus NEGATIVE NEGATIVE     Imaging or Labs ordered: None  Medical history and chart was reviewed and case discussed with medical provider.  Assessment/Plan: 35 year old male with right intra-articular distal tibia and fibula fracture.  Due to the complexity and unstable nature of his injury I recommended proceeding with external fixation versus open reduction internal fixation.  This will depend on his acute swelling  of his injury.  I discussed risks and benefits with both him and his wife.  Risks included but not limited to bleeding, infection, malunion, nonunion, hardware failure, hardware irritation, nerve or blood vessel injury, DVT, posttraumatic arthritis.  They agreed to proceed with surgery and consent was obtained.  Roby Lofts, MD [facility] [phone] (office) [email or web address]

## 2021-06-11 NOTE — Evaluation (Addendum)
Physical Therapy Evaluation Patient Details Name: Richard Ali MRN: [id number] DOB: 05/13/1986 Today's Date: 06/11/2021   History of Present Illness  Patient suffered a closed pylon fx on 06/10/2021 at a trampoline park. He had an ORIF on 06/11/2021  Clinical Impression  Patient required initial supervision for balance and transfers, but with practice with the crutches he was able to ambulate independently. He declined to do steps with therapy. He is confident he will be able to get up with crutches or with the handrail and his wife. Therapy reviewed stair technique with crutches. He is interested in obtaining a knee scooter. He feels he may have crutches at home. He felt most stable with the walker so that is an option as well. He has no need for follow up skilled PT.     Follow Up Recommendations No PT follow up    Equipment Recommendations  Other (comment);Rolling walker with 5" wheels (knee scooter)    Recommendations for Other Services       Precautions / Restrictions Precautions Precautions: Fall Restrictions Weight Bearing Restrictions: Yes RLE Weight Bearing: Non weight bearing      Mobility  Bed Mobility Overal bed mobility: Needs Assistance                  Transfers Overall transfer level: Needs assistance Equipment used: Rolling walker (2 wheeled);Crutches             General transfer comment: Patient needed supervison intially for transfer but as he transefered more he was independnet. He transferd with crutches and a walker  Ambulation/Gait Ambulation/Gait assistance: Supervision Gait Distance (Feet): 100 Feet Assistive device: Crutches Gait Pattern/deviations: Step-to pattern Gait velocity: decreased   General Gait Details: Patient ambualted in the room with the walker and in the hall with the cruthes. He felt more stable with the walker but feels like over time he will be more mobilie with the crutches  Stairs Stairs:  (therapy suggested  patient trial steps but patient declined. he reports he is comfortable doing steps.)          Wheelchair Mobility    Modified Rankin (Stroke Patients Only)       Balance                                             Pertinent Vitals/Pain Pain Assessment: No/denies pain    Home Living Family/patient expects to be discharged to:: Private residence Living Arrangements: Spouse/significant other Available Help at Discharge: Family Type of Home: House Home Access: Stairs to enter Entrance Stairs-Rails: Right Entrance Stairs-Number of Steps: 3          Prior Function Level of Independence: Independent         Comments: Patient was indepdnenet and active. He is going to school at [employer]     Hand Dominance   Dominant Hand: Right    Extremity/Trunk Assessment   Upper Extremity Assessment Upper Extremity Assessment: Defer to OT evaluation    Lower Extremity Assessment Lower Extremity Assessment: RLE deficits/detail RLE Deficits / Details: R ankle immobilized can mlve the rest of his right LE vs gravity    Cervical / Trunk Assessment Cervical / Trunk Assessment: Normal  Communication   Communication: No difficulties  Cognition Arousal/Alertness: Awake/alert Behavior During Therapy: WFL for tasks assessed/performed Overall Cognitive Status: Within Functional Limits for tasks assessed  General Comments General comments (skin integrity, edema, etc.): right ankle wrapped    Exercises     Assessment/Plan    PT Assessment Patent does not need any further PT services  PT Problem List         PT Treatment Interventions      PT Goals (Current goals can be found in the Care Plan section)  Acute Rehab PT Goals Patient Stated Goal: to go home PT Goal Formulation: With patient Time For Goal Achievement: 06/18/21 Potential to Achieve Goals: Good    Frequency     Barriers to discharge         Co-evaluation               AM-PAC PT "6 Clicks" Mobility  Outcome Measure Help needed turning from your back to your side while in a flat bed without using bedrails?: None Help needed moving from lying on your back to sitting on the side of a flat bed without using bedrails?: None Help needed moving to and from a bed to a chair (including a wheelchair)?: None Help needed standing up from a chair using your arms (e.g., wheelchair or bedside chair)?: None Help needed to walk in hospital room?: None Help needed climbing 3-5 steps with a railing? : A Little 6 Click Score: 23    End of Session Equipment Utilized During Treatment: Gait belt Activity Tolerance: Patient tolerated treatment well Patient left: in bed;with family/visitor present Nurse Communication: Mobility status;Other (comment) PT Visit Diagnosis: Other abnormalities of gait and mobility (R26.89)    Time: 0370-4888 PT Time Calculation (min) (ACUTE ONLY): 17 min   Charges:   PT Evaluation $PT Eval Low Complexity: 1 Low           Dessie Coma PT DPT 06/11/2021, 12:06 PM

## 2021-06-11 NOTE — Anesthesia Procedure Notes (Signed)
Anesthesia Regional Block: Adductor canal block   Pre-Anesthetic Checklist: , timeout performed,  Correct Patient, Correct Site, Correct Laterality,  Correct Procedure, Correct Position, site marked,  Risks and benefits discussed,  Surgical consent,  Pre-op evaluation,  At surgeon's request and post-op pain management  Laterality: Right  Prep: chloraprep       Needles:  Injection technique: Single-shot  Needle Type: Echogenic Stimulator Needle     Needle Length: 9cm  Needle Gauge: 21     Additional Needles:   Procedures:,,,, ultrasound used (permanent image in chart),,    Narrative:  Start time: 06/11/2021 10:05 AM End time: 06/11/2021 10:10 AM Injection made incrementally with aspirations every 5 mL.  Performed by: Personally  Anesthesiologist: Shelton Silvas, MD  Additional Notes: Patient tolerated the procedure well. Local anesthetic introduced in an incremental fashion under minimal resistance after negative aspirations. No paresthesias were elicited. After completion of the procedure, no acute issues were identified and patient continued to be monitored by RN.

## 2021-06-11 NOTE — TOC Transition Note (Signed)
Transition of Care [facility]) - CM/SW Discharge Note   Patient Details  Name: Richard Ali MRN: [id number] Date of Birth: 06-25-1986  [facility] CM/SW Contact:  Bess Kinds, RN Phone Number: [phone] 06/11/2021, 4:02 PM   Clinical Narrative:     Spoke with patient and spouse at the bedside to discuss transition plans. Patient would like to get a knee scooter. Advised that not available in house; however, [employer] has at retail store. Patient not interested in walker stating that he has crutches at home. No further TOC needs identified.   Final next level of care: Home/Self Care Barriers to Discharge: No Barriers Identified   Patient Goals and CMS Choice        Discharge Placement                       Discharge Plan and Services                                     Social Determinants of Health (SDOH) Interventions     Readmission Risk Interventions No flowsheet data found.

## 2021-06-11 NOTE — Anesthesia Procedure Notes (Signed)
Procedure Name: LMA Insertion Date/Time: 06/11/2021 7:36 AM Performed by: Jodell Cipro, CRNA Pre-anesthesia Checklist: Patient identified, Emergency Drugs available, Suction available and Patient being monitored Patient Re-evaluated:Patient Re-evaluated prior to induction Oxygen Delivery Method: Circle System Utilized Preoxygenation: Pre-oxygenation with 100% oxygen Induction Type: IV induction Ventilation: Mask ventilation without difficulty LMA: LMA inserted LMA Size: 5.0 Number of attempts: 1 Placement Confirmation: positive ETCO2 Tube secured with: Tape Dental Injury: Teeth and Oropharynx as per pre-operative assessment

## 2021-06-11 NOTE — Anesthesia Procedure Notes (Addendum)
Anesthesia Regional Block: Popliteal block   Pre-Anesthetic Checklist: , timeout performed,  Correct Patient, Correct Site, Correct Laterality,  Correct Procedure, Correct Position, site marked,  Risks and benefits discussed,  Surgical consent,  Pre-op evaluation,  At surgeon's request and post-op pain management  Laterality: Right  Prep: chloraprep       Needles:  Injection technique: Single-shot  Needle Type: Echogenic Stimulator Needle     Needle Length: 9cm  Needle Gauge: 21     Additional Needles:   Procedures:,,,, ultrasound used (permanent image in chart),,    Narrative:  Start time: 06/11/2021 10:00 AM End time: 06/11/2021 10:05 AM Injection made incrementally with aspirations every 5 mL.  Performed by: Personally  Anesthesiologist: Shelton Silvas, MD  Additional Notes: Patient tolerated the procedure well. Local anesthetic introduced in an incremental fashion under minimal resistance after negative aspirations. No paresthesias were elicited. After completion of the procedure, no acute issues were identified and patient continued to be monitored by RN.

## 2021-06-11 NOTE — Discharge Instructions (Signed)
Truitt Merle, MD [facility] PA-C [facility] [address] [phone] Jani Files)   [phone] (fax)                                  POST-OPERATIVE INSTRUCTIONS     WEIGHT BEARING STATUS: Non-weightbearing right lower extremity  RANGE OF MOTION/ACTIVITY: Ok for knee motion as tolerated. Do not remove splint   WOUND CARE Please keep splint clean dry and intact until follow-up. If your splint gets wet for any reason please contact the office immediately.  Do not stick anything down your splint such as pencils, momey, hangers to try and scratch yourself.  If you feel itchy take Benadryl as prescribed on the bottle for itching You may shower on Post-Op Day #2.  You must keep splint dry during this process and may find that a plastic bag taped around the extremity or alternatively a towel based bath may be a better option.   If you get your splint wet or if it is damaged please contact our clinic.  EXERCISES Due to your splint being in place you will not be able to bear weight through your extremity.   DO NOT PUT ANY WEIGHT ON YOUR OPERATIVE LEG Please use crutches or a walker to avoid weight bearing.   DVT/PE prophylaxis: Aspirin  DIET: As you were eating previously.  Can use over the counter stool softeners and bowel preparations, such as Miralax, to help with bowel movements.  Narcotics can be constipating.  Be sure to drink plenty of fluids  REGIONAL ANESTHESIA (NERVE BLOCKS) The anesthesia team may have performed a nerve block for you if safe in the setting of your care.  This is a great tool used to minimize pain.  Typically the block may start wearing off overnight but the long acting medicine may last for 3-4 days.  The nerve block wearing off can be a challenging period but please utilize your as needed pain medications to try and manage this period.    POST-OP MEDICATIONS- Multimodal approach to pain control  In general your pain will be  controlled with a combination of substances.  Prescriptions unless otherwise discussed are electronically sent to your pharmacy.  This is a carefully made plan we use to minimize narcotic use.     - Meloxicam OR Celebrex - Anti-inflammatory medication taken on a scheduled basis  - Acetaminophen - Non-narcotic pain medicine taken on a scheduled basis   - Oxycodone - This is a strong narcotic, to be used only on an "as needed" basis for pain.  -  Aspirin 325mg  - This medicine is used to minimize the risk of blood clots after surgery.             -          Zofran - take as needed for nausea   FOLLOW-UP If you develop a Fever (>101.5), Redness or Drainage from the surgical incision site, please call our office to arrange for an evaluation. Please call the office to schedule a follow-up appointment for your incision check if you do not already have one, 7-10 days post-operatively.   VISIT OUR WEBSITE FOR ADDITIONAL INFORMATION: [email or web address]   HELPFUL INFORMATION  If you had a block, it will wear off between 8-24 hrs postop typically.  This is period when your pain may go from nearly zero to the pain you would have had postop without the  block.  This is an abrupt transition but nothing dangerous is happening.  You may take an extra dose of narcotic when this happens.  You should wean off your narcotic medicines as soon as you are able.  Most patients will be off or using minimal narcotics before their first postop appointment.   We suggest you use the pain medication the first night prior to going to bed, in order to ease any pain when the anesthesia wears off. You should avoid taking pain medications on an empty stomach as it will make you nauseous.  Do not drink alcoholic beverages or take illicit drugs when taking pain medications.  In most states it is against the law to drive while you are in a splint or sling.  And certainly against the law to drive while taking narcotics.  You may  return to work/school in the next couple of days when you feel up to it.   Pain medication may make you constipated.  Below are a few solutions to try in this order: Decrease the amount of pain medication if you aren't having pain. Drink lots of decaffeinated fluids. Drink prune juice and/or each dried prunes  If the first 3 don't work start with additional solutions Take Colace - an over-the-counter stool softener Take Senokot - an over-the-counter laxative Take Miralax - a stronger over-the-counter laxative

## 2021-06-13 ENCOUNTER — Encounter: Payer: Self-pay | Admitting: *Deleted

## 2021-06-13 ENCOUNTER — Other Ambulatory Visit: Payer: Self-pay | Admitting: *Deleted

## 2021-06-13 NOTE — Discharge Summary (Signed)
Orthopaedic Trauma Service (OTS) Discharge Summary   Patient ID: Richard Ali MRN: [id number] DOB/AGE: 1985-12-21 35 y.o.  Admit date: 06/10/2021 Discharge date: 06/11/2021  Admission Diagnoses: Right distal tibia/fibula fracture  Discharge Diagnoses:  Active Problems:   Ankle fracture   Closed right pilon fracture, initial encounter   Past Medical History:  Diagnosis Date   ADHD (attention deficit hyperactivity disorder)      Procedures Performed: CPT 27828-Open reduction internal fixation of right pilon fracture (tibia and fibula)  Discharged Condition: good  Hospital Course: Patient presented to med [facility] emergency department on 06/10/2021 for evaluation of right ankle pain after trampoline injury.  Was found to have right distal tibia/fibular fracture.  Orthopedics was consulted for evaluation and management.  Due to the complex nature of the injury Dr. Roda Shutters felt this was outside of his scope.  He asked orthopedic trauma service to evaluate for definitive management.  Patient was subsequently transferred to [facility] with plans for surgical fixation.  Patient placed in short leg splint overnight.  Patient received a regional nerve block and was taken to the operating room by Dr. Jena Gauss on 06/11/2021 for the above procedure.  Tolerated this well without complications.  Was placed back in a short leg splint postoperatively and instructed be nonweightbearing on the right lower extremity.  Was evaluated by Physical and Occupational Therapy on postop day 0 and was noted to be suited for discharge.  On the afternoon of 06/11/2021, the patient was tolerating diet, working well with therapies, pain well controlled, vital signs stable, dressings clean, dry, intact and felt stable for discharge to home. Patient will follow up as below and knows to call with questions or concerns.     Consults: None  Significant Diagnostic Studies:   Results for orders placed or  performed during the hospital encounter of 06/10/21 (from the past 168 hour(s))  Resp Panel by RT-PCR (Flu A&B, Covid) Nasopharyngeal Swab   Collection Time: 06/10/21  9:42 PM   Specimen: Nasopharyngeal Swab; Nasopharyngeal(NP) swabs in vial transport medium  Result Value Ref Range   SARS Coronavirus 2 by RT PCR NEGATIVE NEGATIVE   Influenza A by PCR NEGATIVE NEGATIVE   Influenza B by PCR NEGATIVE NEGATIVE  Basic metabolic panel   Collection Time: 06/10/21  9:42 PM  Result Value Ref Range   Sodium 139 135 - 145 mmol/L   Potassium 3.4 (L) 3.5 - 5.1 mmol/L   Chloride 105 98 - 111 mmol/L   CO2 24 22 - 32 mmol/L   Glucose, Bld 108 (H) 70 - 99 mg/dL   BUN 10 6 - 20 mg/dL   Creatinine, Ser 2.42 0.61 - 1.24 mg/dL   Calcium 9.2 8.9 - 35.3 mg/dL   GFR, Estimated >61 >44 mL/min   Anion gap 10 5 - 15  CBC with Differential   Collection Time: 06/10/21  9:42 PM  Result Value Ref Range   WBC 20.0 (H) 4.0 - 10.5 K/uL   RBC 5.01 4.22 - 5.81 MIL/uL   Hemoglobin 15.3 13.0 - 17.0 g/dL   HCT 31.5 40.0 - 86.7 %   MCV 85.0 80.0 - 100.0 fL   MCH 30.5 26.0 - 34.0 pg   MCHC 35.9 30.0 - 36.0 g/dL   RDW 61.9 50.9 - 32.6 %   Platelets 286 150 - 400 K/uL   nRBC 0.0 0.0 - 0.2 %   Neutrophils Relative % 86 %   Neutro Abs 17.3 (H) 1.7 - 7.7 K/uL  Lymphocytes Relative 8 %   Lymphs Abs 1.5 0.7 - 4.0 K/uL   Monocytes Relative 5 %   Monocytes Absolute 1.0 0.1 - 1.0 K/uL   Eosinophils Relative 0 %   Eosinophils Absolute 0.0 0.0 - 0.5 K/uL   Basophils Relative 0 %   Basophils Absolute 0.1 0.0 - 0.1 K/uL   Immature Granulocytes 1 %   Abs Immature Granulocytes 0.11 (H) 0.00 - 0.07 K/uL  Surgical pcr screen   Collection Time: 06/11/21  1:54 AM   Specimen: Nasal Mucosa; Nasal Swab  Result Value Ref Range   MRSA, PCR NEGATIVE NEGATIVE   Staphylococcus aureus NEGATIVE NEGATIVE  VITAMIN D 25 Hydroxy (Vit-D Deficiency, Fractures)   Collection Time: 06/11/21 11:45 AM  Result Value Ref Range   Vit D,  25-Hydroxy 28.14 (L) 30 - 100 ng/mL  CBC   Collection Time: 06/11/21 11:45 AM  Result Value Ref Range   WBC 13.9 (H) 4.0 - 10.5 K/uL   RBC 4.68 4.22 - 5.81 MIL/uL   Hemoglobin 14.0 13.0 - 17.0 g/dL   HCT 16.1 09.6 - 04.5 %   MCV 86.3 80.0 - 100.0 fL   MCH 29.9 26.0 - 34.0 pg   MCHC 34.7 30.0 - 36.0 g/dL   RDW 40.9 81.1 - 91.4 %   Platelets 280 150 - 400 K/uL   nRBC 0.0 0.0 - 0.2 %  Creatinine, serum   Collection Time: 06/11/21 11:45 AM  Result Value Ref Range   Creatinine, Ser 1.11 0.61 - 1.24 mg/dL   GFR, Estimated >78 >29 mL/min     Treatments: IV hydration, antibiotics: Ancef, analgesia: acetaminophen, Dilaudid, and oxycodone, therapies: PT, and surgery: As above  Discharge Exam: General: No acute distress Respiratory: No increased work of breathing at rest RLE: Well-padded, well fitting short leg splint in place.  Regional nerve block in full effect, no motor or sensory function of the toes currently.  Foot warm and well-perfused  Disposition: Discharge disposition: 01-Home or Self Care        Allergies as of 06/11/2021       Reactions   Bee Venom         Medication List     TAKE these medications    amphetamine-dextroamphetamine 20 MG tablet Commonly known as: Adderall Take 1 tablet (20 mg total) by mouth daily before breakfast. What changed:  when to take this additional instructions Another medication with the same name was removed. Continue taking this medication, and follow the directions you see here. Notes to patient: Resume home regimen   aspirin EC 325 MG tablet Take 1 tablet (325 mg total) by mouth in the morning and at bedtime.   methocarbamol 500 MG tablet Commonly known as: ROBAXIN Take 1 tablet (500 mg total) by mouth every 6 (six) hours as needed for muscle spasms. Notes to patient: Last dose given 09/10 02:29am   ondansetron 4 MG tablet Commonly known as: ZOFRAN Take 1 tablet (4 mg total) by mouth every 6 (six) hours as needed  for nausea. Notes to patient: Last dose given 09/10 07:45am   oxyCODONE-acetaminophen 5-325 MG tablet Commonly known as: Percocet Take 1 tablet by mouth every 4 (four) hours as needed for severe pain.        Follow-up Information     Haddix, Gillie Manners, MD. Schedule an appointment as soon as possible for a visit in 2 week(s).   Specialty: Orthopedic Surgery Why: for splint and suture removal Contact information: [address] [place]  [place] [address] [phone]                 Discharge Instructions and Plan: Patient will be discharged to home. Will be discharged on Aspirin for DVT prophylaxis. Patient has been provided with all the necessary DME for discharge. Patient will follow up with Dr. Jena Gauss in 2 weeks for repeat x-rays and suture removal.   Signed:  Shawn Route. Ladonna Snide ?[phone]? (phone) 06/13/2021, 7:13 AM  [facility] [address] [place] [address] [phone] (F)

## 2021-06-13 NOTE — Patient Outreach (Signed)
[facility]) Care Management  06/13/2021  Richard Ali 08-Dec-1985 [id number]   Transition of care call/case closure   Referral received:06/13/21 Initial outreach:06/13/21 Insurance: [employer]    Subjective: Initial successful telephone call to patient's preferred number in order to complete transition of care assessment; 2 HIPAA identifiers verified. Explained purpose of call and completed transition of care assessment.  Richard Ali states that he is doing okay. He denies post-operative problems, says surgical incisions are unremarkable, states surgical pain well managed with prescribed medications. He reports tolerating use of crutches in the home,splint in place to right foot area, reports wiggles toes denies increase in swelling keeping leg elevated. His wife confirms that she has contact number for [employer] to inquire regarding knee scooter if they decide to pursue . He reports  tolerating diet, denies bowel or bladder problems.  Spouse/family are assisting with his recovery.   Reviewed accessing the following [place] :  He is not using a [facility] outpatient pharmacy.     Objective:  Richard Ali  was hospitalized at [facility] 9/9-9/10/22  for closed right pilon fracture ORIF [place] . Comorbidities include: ADHD He  was discharged to home on 06/11/21  without the need for home health services or DME.   Assessment:  Patient voices good understanding of all discharge instructions.  See transition of care flowsheet for assessment details.   Plan:  Reviewed hospital discharge diagnosis of Closed right pilon fracture, ORIF   and discharge treatment plan using hospital discharge instructions, assessing medication adherence, reviewing problems requiring provider notification, and discussing the importance of follow up with surgeon, primary care provider and/or specialists as directed.   No ongoing care management needs identified so  will close case to [facility] Care Management services and route successful outreach letter with [facility] Care Management pamphlet and 24 Hour Nurse Line Magnet to [employer] Care Management clinical pool to be mailed to patient's home address.    Egbert Garibaldi, RN, BSN  [facility] Care Management,Care Management Coordinator  [phone]- Mobile [phone]- Toll Free Main Office

## 2021-06-14 ENCOUNTER — Encounter (HOSPITAL_COMMUNITY): Payer: Self-pay | Admitting: Student

## 2021-06-16 MED FILL — Fentanyl Citrate Preservative Free (PF) Inj 100 MCG/2ML: INTRAMUSCULAR | Qty: 3 | Status: AC

## 2021-06-21 DIAGNOSIS — S82871D Displaced pilon fracture of right tibia, subsequent encounter for closed fracture with routine healing: Secondary | ICD-10-CM | POA: Diagnosis not present

## 2021-07-19 DIAGNOSIS — S82871D Displaced pilon fracture of right tibia, subsequent encounter for closed fracture with routine healing: Secondary | ICD-10-CM | POA: Diagnosis not present

## 2021-08-11 ENCOUNTER — Other Ambulatory Visit: Payer: Self-pay

## 2021-08-11 ENCOUNTER — Ambulatory Visit: Payer: 59 | Attending: Student | Admitting: Physical Therapy

## 2021-08-11 DIAGNOSIS — M25671 Stiffness of right ankle, not elsewhere classified: Secondary | ICD-10-CM | POA: Insufficient documentation

## 2021-08-11 DIAGNOSIS — R600 Localized edema: Secondary | ICD-10-CM | POA: Diagnosis not present

## 2021-08-11 DIAGNOSIS — M25571 Pain in right ankle and joints of right foot: Secondary | ICD-10-CM | POA: Insufficient documentation

## 2021-08-11 NOTE — Patient Instructions (Signed)
PROGHROAMME EXERCISE PROGRAM Created by [place] Briella Hobday Nov 10th, 2022 View at [email or web address]-exercise-[email or web address] using code: 7T3UP63 Total 1 Page 1 of 1 CALF STRETCH WITH TOWEL While in a seated position, hook a towel under your foot and pull your ankle back until a stretch is felt on your calf area. Keep your knee in a straightened position during the stretch. Repeat 5 Times Hold 1 Minute Complete 1 Set Perform 3 Times a Day

## 2021-08-11 NOTE — Therapy (Signed)
[facility] [address] [place], [place], [address] Phone: [phone]   Fax:  [phone]  Physical Therapy Evaluation  Patient Details  Name: Richard Ali MRN: [id number] Date of Birth: 11/08/1985 Referring Provider (PT): Truitt Merle MD   Encounter Date: 08/11/2021   PT End of Session - 08/11/21 1201     Visit Number 1    Number of Visits 16    Date for PT Re-Evaluation 10/06/21    Authorization Type FOTO.    PT Start Time 0822    PT Stop Time 0902    PT Time Calculation (min) 40 min    Activity Tolerance Patient tolerated treatment well    Behavior During Therapy WFL for tasks assessed/performed             Past Medical History:  Diagnosis Date   ADHD (attention deficit hyperactivity disorder)     Past Surgical History:  Procedure Laterality Date   APPENDECTOMY     ORIF ANKLE FRACTURE Right 06/11/2021   Procedure: ORIF PILON fracture;  Surgeon: Roby Lofts, MD;  Location: [facility] OR;  Service: Orthopedics;  Laterality: Right;   WISDOM TOOTH EXTRACTION      There were no vitals filed for this visit.    Subjective Assessment - 08/11/21 1203     Subjective COVID-19 screen performed prior to patient entering clinic.  The patient presents to the clinic today s/p right tib/fib fracture and subsquent ORIF performed on 06/11/21.  Upon presentation to the cclinic today he is in normal footwear.  His pain is of a moderate nature increasing with increased up time.  He has been perfoming the ankle ABC's at home.  rest decrease his pain.    Pertinent History Unremarkable.    How long can you stand comfortably? < an hour.    How long can you walk comfortably? Has walked quite a bit around campus for college classes.    Patient Stated Goals Get back to normal.    Currently in Pain? Yes    Pain Score 3     Pain Location Ankle    Pain Orientation Right    Pain Descriptors / Indicators Aching;Numbness;Shooting    Pain  Type Acute pain    Pain Onset More than a month ago    Pain Frequency Intermittent    Aggravating Factors  See above.    Pain Relieving Factors See above.                [facility] PT Assessment - 08/11/21 0001       Assessment   Medical Diagnosis Right tib/fib s/p ORIF    Referring Provider (PT) Truitt Merle MD    Onset Date/Surgical Date 06/10/21      Precautions   Precautions None      Restrictions   Weight Bearing Restrictions No      Balance Screen   Has the patient fallen in the past 6 months No    Has the patient had a decrease in activity level because of a fear of falling?  No    Is the patient reluctant to leave their home because of a fear of falling?  No      Home Environment   Living Environment Private residence      Prior Function   Level of Independence Independent      Observation/Other Assessments   Observations Right ankle incisions appear to be healing well.  Notable right calf atophy.    Focus  on Therapeutic Outcomes (FOTO)  Complete.      Observation/Other Assessments-Edema    Edema Circumferential      Circumferential Edema   Circumferential - Right Bi-malleolar:  RT 3.5 cms > LT.      ROM / Strength   AROM / PROM / Strength AROM      Strength   Overall Strength Comments Active right ankle dorsiflexion is 5 degrees with right knee in full extension and 10 degrees with knee flexed.  Inversion is 20 degrees and 10 degrees of everesion.      Palpation   Palpation comment No significant tenderness.      Ambulation/Gait   Gait Comments The patient walking with a somewhat flat-footed gait pattern and decreased stance time over his right LE.                        Objective measurements completed on examination: See above findings.       [facility] Adult PT Treatment/Exercise - 08/11/21 0001       Modalities   Modalities Vasopneumatic      Vasopneumatic   Number Minutes Vasopneumatic  15 minutes    Vasopnuematic Location   --   Right ankle.   Vasopneumatic Pressure Low                          PT Long Term Goals - 08/11/21 1219       PT LONG TERM GOAL #1   Title Independent with a HEP.    Time 8    Period Weeks    Status New      PT LONG TERM GOAL #2   Title Increase right ankle dorsiflexion to 10 degrees to normalize the patient's gait pattern.    Time 8    Period Weeks    Status New      PT LONG TERM GOAL #3   Title Increase right ankle strength to 5/5 to increase stability for functional tasks.    Time 8    Period Weeks    Status New      PT LONG TERM GOAL #4   Title Walk a community distance with right ankle pain not > 2/10.    Time 8    Period Weeks    Status New                    Plan - 08/11/21 1213     Clinical Impression Statement The patient presents to [facility] s/p right tib/fib ORIF.  He is now is normal footwear and walking with a somewht flat-footed gait pattern and decreased stance time over his right LE.  He has a loss of active right ankle range of motion but has been doing ankle ABC's at home which he states has helped a lot.  He has an expected amount of right ankle edema.  His pain increases the more he is up.  Patient will benefit from skilled physical therapy intervention to address pain and deficits.    Personal Factors and Comorbidities Other    Examination-Activity Limitations Locomotion Level;Stand;Other    Examination-Participation Restrictions Other;Yard Work    Stability/Clinical Decision Making Stable/Uncomplicated    Optometrist Low    Rehab Potential Excellent    PT Frequency 2x / week    PT Duration 8 weeks    PT Treatment/Interventions ADLs/Self Care Home Management;Cryotherapy;Electrical Stimulation;Moist Heat;Gait training;Stair training;Functional mobility training;Therapeutic activities;Therapeutic exercise;Neuromuscular  re-education;Manual techniques;Patient/family education;Passive range of motion;Vasopneumatic  Device    PT Next Visit Plan Nustep, rockerboard, ankle isolator, theraband exercise for HEP, standing Gastroc-Soloes stretch.  PRE's.  Vasopneumatic.    Consulted and Agree with Plan of Care Patient             Patient will benefit from skilled therapeutic intervention in order to improve the following deficits and impairments:  Pain, Abnormal gait, Decreased activity tolerance, Increased edema, Decreased range of motion  Visit Diagnosis: Pain in right ankle and joints of right foot - Plan: PT plan of care cert/re-cert  Stiffness of right ankle, not elsewhere classified - Plan: PT plan of care cert/re-cert  Localized edema - Plan: PT plan of care cert/re-cert     Problem List Patient Active Problem List   Diagnosis Date Noted   Ankle fracture 06/10/2021   Closed right pilon fracture, initial encounter 06/10/2021   ADHD 02/04/2020   Controlled substance agreement signed 02/04/2020    Kennita Pavlovich, [place], PT 08/11/2021, 12:22 PM  [facility] [address] [place], [place], [address] Phone: [phone]   Fax:  [phone]  Name: Richard Ali MRN: [id number] Date of Birth: 1986/01/18

## 2021-08-16 ENCOUNTER — Ambulatory Visit: Payer: 59

## 2021-08-16 ENCOUNTER — Other Ambulatory Visit: Payer: Self-pay

## 2021-08-16 DIAGNOSIS — M25571 Pain in right ankle and joints of right foot: Secondary | ICD-10-CM

## 2021-08-16 DIAGNOSIS — S82871D Displaced pilon fracture of right tibia, subsequent encounter for closed fracture with routine healing: Secondary | ICD-10-CM | POA: Diagnosis not present

## 2021-08-16 DIAGNOSIS — R600 Localized edema: Secondary | ICD-10-CM

## 2021-08-16 DIAGNOSIS — M25671 Stiffness of right ankle, not elsewhere classified: Secondary | ICD-10-CM | POA: Diagnosis not present

## 2021-08-16 NOTE — Therapy (Signed)
[facility] [address] [place], [place], [address] Phone: [phone]   Fax:  [phone]  Physical Therapy Treatment  Patient Details  Name: Richard Ali MRN: [id number] Date of Birth: [birth year] Referring Provider (PT): Truitt Merle MD   Encounter Date: 08/16/2021   PT End of Session - 08/16/21 0730     Visit Number 2    Number of Visits 16    Date for PT Re-Evaluation 10/06/21    Authorization Type FOTO.    PT Start Time 0730    PT Stop Time 0813    PT Time Calculation (min) 43 min    Activity Tolerance Patient tolerated treatment well    Behavior During Therapy WFL for tasks assessed/performed             Past Medical History:  Diagnosis Date   ADHD (attention deficit hyperactivity disorder)     Past Surgical History:  Procedure Laterality Date   APPENDECTOMY     ORIF ANKLE FRACTURE Right 06/11/2021   Procedure: ORIF PILON fracture;  Surgeon: Roby Lofts, MD;  Location: [facility] OR;  Service: Orthopedics;  Laterality: Right;   WISDOM TOOTH EXTRACTION      There were no vitals filed for this visit.   Subjective Assessment - 08/16/21 0729     Subjective COVID-19 screen performed prior to patient entering clinic. Patient reports that his ankle feels alright today.    Pertinent History Unremarkable.    How long can you stand comfortably? < an hour.    How long can you walk comfortably? Has walked quite a bit around campus for college classes.    Patient Stated Goals Get back to normal.    Currently in Pain? No/denies    Pain Onset More than a month ago                                [facility] Adult PT Treatment/Exercise - 08/16/21 0001       Exercises   Exercises Ankle      Ankle Exercises: Stretches   Soleus Stretch 4 reps;30 seconds    Soleus Stretch Limitations Long sitting    Gastroc Stretch 4 reps;30 seconds    Gastroc Stretch Limitations Long sitting      Ankle Exercises:  Aerobic   Nustep L5 x 12 minutes      Ankle Exercises: Standing   Rocker Board 3 minutes    Other Standing Ankle Exercises Forward lunges   onto 6" step, 20 reps each   Other Standing Ankle Exercises Marching   2 minutes; green t-band around feet                Balance Exercises - 08/16/21 0001       Balance Exercises: Standing   Tandem Stance Intermittent upper extremity support;Foam/compliant surface;Eyes closed;30 secs;4 reps    Marching Foam/compliant surface;Upper extremity assist 2;Static   2 minutes on BOSU                    PT Long Term Goals - 08/11/21 1219       PT LONG TERM GOAL #1   Title Independent with a HEP.    Time 8    Period Weeks    Status New      PT LONG TERM GOAL #2   Title Increase right ankle dorsiflexion to 10 degrees to normalize the patient's gait pattern.  Time 8    Period Weeks    Status New      PT LONG TERM GOAL #3   Title Increase right ankle strength to 5/5 to increase stability for functional tasks.    Time 8    Period Weeks    Status New      PT LONG TERM GOAL #4   Title Walk a community distance with right ankle pain not > 2/10.    Time 8    Period Weeks    Status New                   Plan - 08/16/21 0733     Clinical Impression Statement Patient presented to his first follow up reporting no ankle pain or discomfort. He was introduced to multiple new interventions for improved ankle strength and mobility. He required minimal cuing with today's interventions for proper exercise biomechanics to facilitate improved ankle mobility. He reported no ankle pain or discomfort with today's interventions. He reported that his ankle felt a little tired upon the conclusion of treatment. He continues to require skilled physical therapy to address his remaining impairments to return to his prior level of function.    Personal Factors and Comorbidities Other    Examination-Activity Limitations Locomotion  Level;Stand;Other    Examination-Participation Restrictions Other;Yard Work    Stability/Clinical Decision Making Stable/Uncomplicated    Rehab Potential Excellent    PT Frequency 2x / week    PT Duration 8 weeks    PT Treatment/Interventions ADLs/Self Care Home Management;Cryotherapy;Electrical Stimulation;Moist Heat;Gait training;Stair training;Functional mobility training;Therapeutic activities;Therapeutic exercise;Neuromuscular re-education;Manual techniques;Patient/family education;Passive range of motion;Vasopneumatic Device    PT Next Visit Plan Nustep, rockerboard, ankle isolator, theraband exercise for HEP, standing Gastroc-Soloes stretch.  PRE's.  Vasopneumatic.    Consulted and Agree with Plan of Care Patient             Patient will benefit from skilled therapeutic intervention in order to improve the following deficits and impairments:  Pain, Abnormal gait, Decreased activity tolerance, Increased edema, Decreased range of motion  Visit Diagnosis: Pain in right ankle and joints of right foot  Stiffness of right ankle, not elsewhere classified  Localized edema     Problem List Patient Active Problem List   Diagnosis Date Noted   Ankle fracture 06/10/2021   Closed right pilon fracture, initial encounter 06/10/2021   ADHD 02/04/2020   Controlled substance agreement signed 02/04/2020    Granville Lewis, PT 08/16/2021, 8:16 AM  [facility] [address] [place], [place], [address] Phone: [phone]   Fax:  [phone]  Name: Richard Ali MRN: [id number] Date of Birth: 1986-03-31

## 2021-08-23 ENCOUNTER — Ambulatory Visit: Payer: 59

## 2021-09-18 IMAGING — CR DG ANKLE COMPLETE 3+V*R*
3 series · 3 of 3 positions shown · non-contrast
Comparison: None.

CLINICAL DATA: Right ankle injury

EXAM:
RIGHT ANKLE - COMPLETE 3+ VIEW

[t ankle joint ap right]
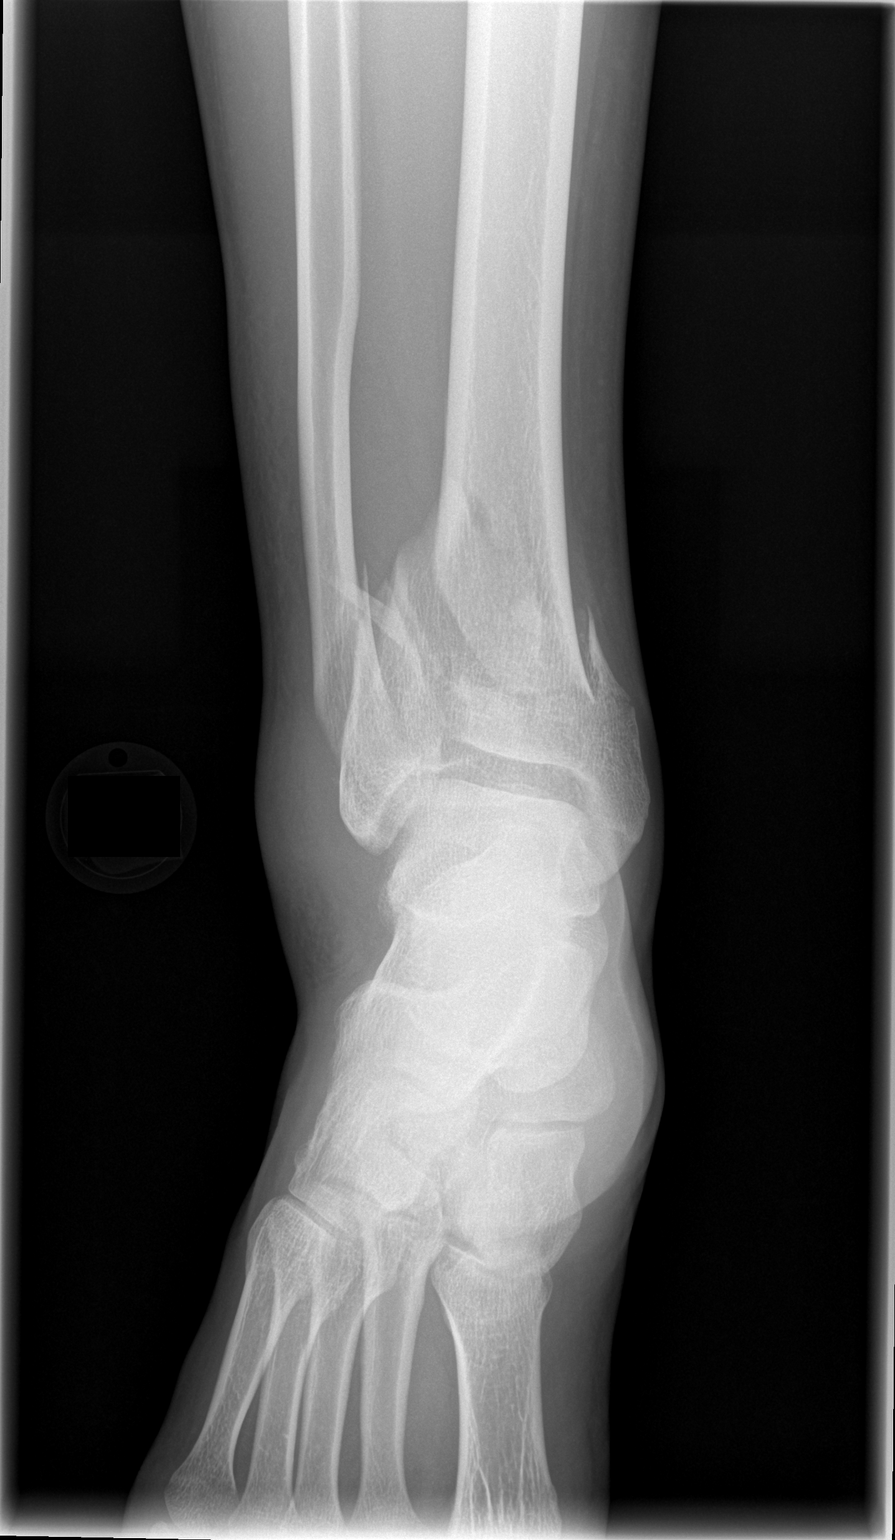

[t ankle joint oblique right]
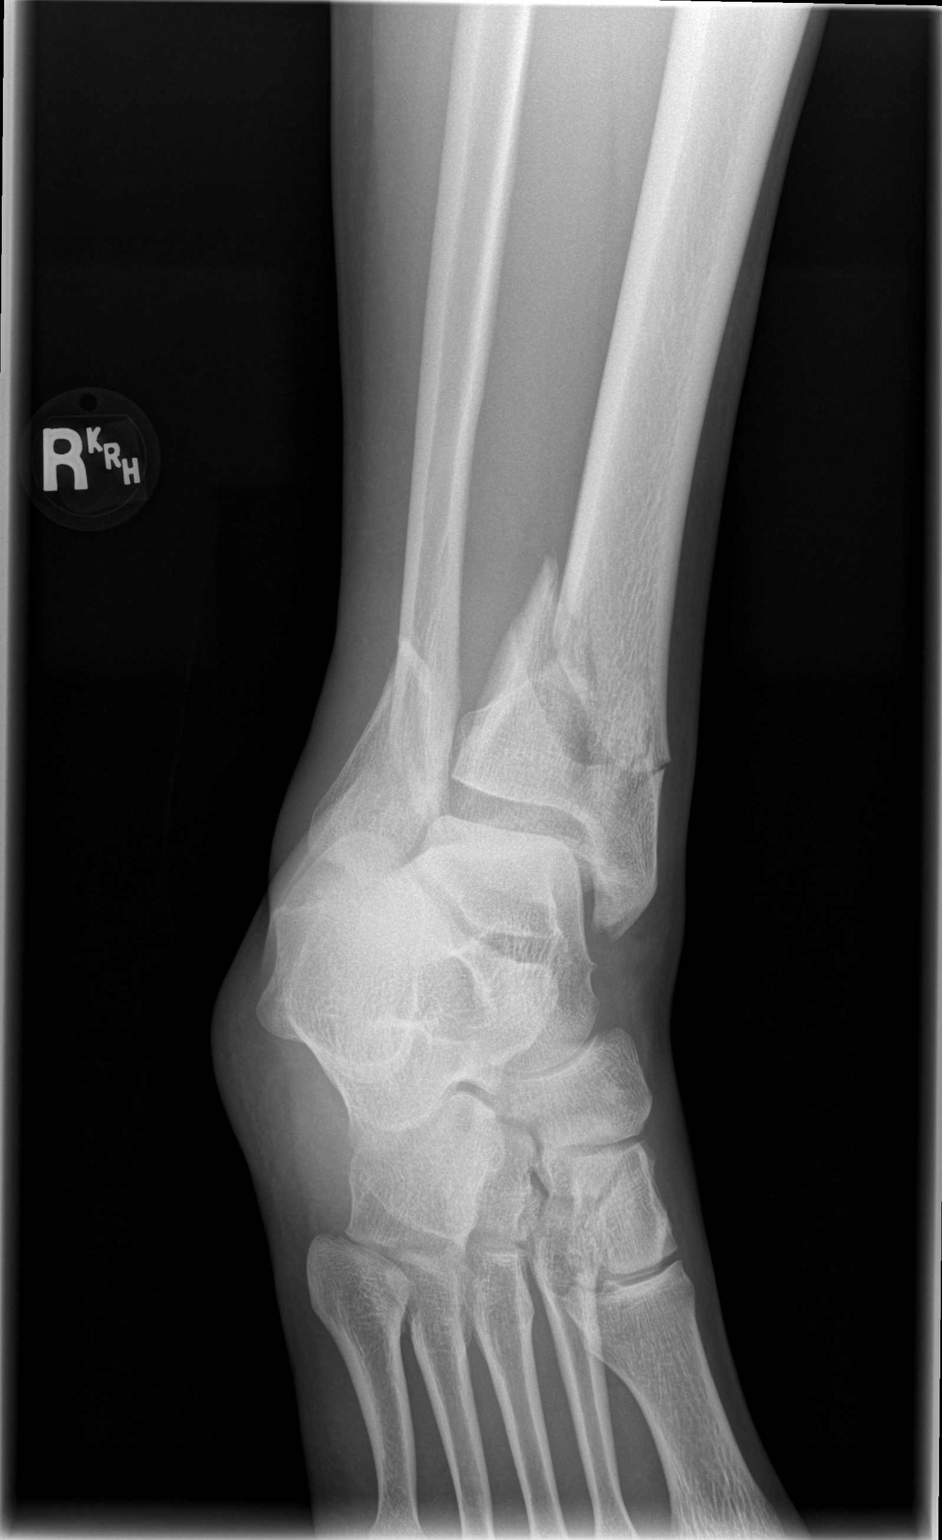

[t ankle joint lat right]
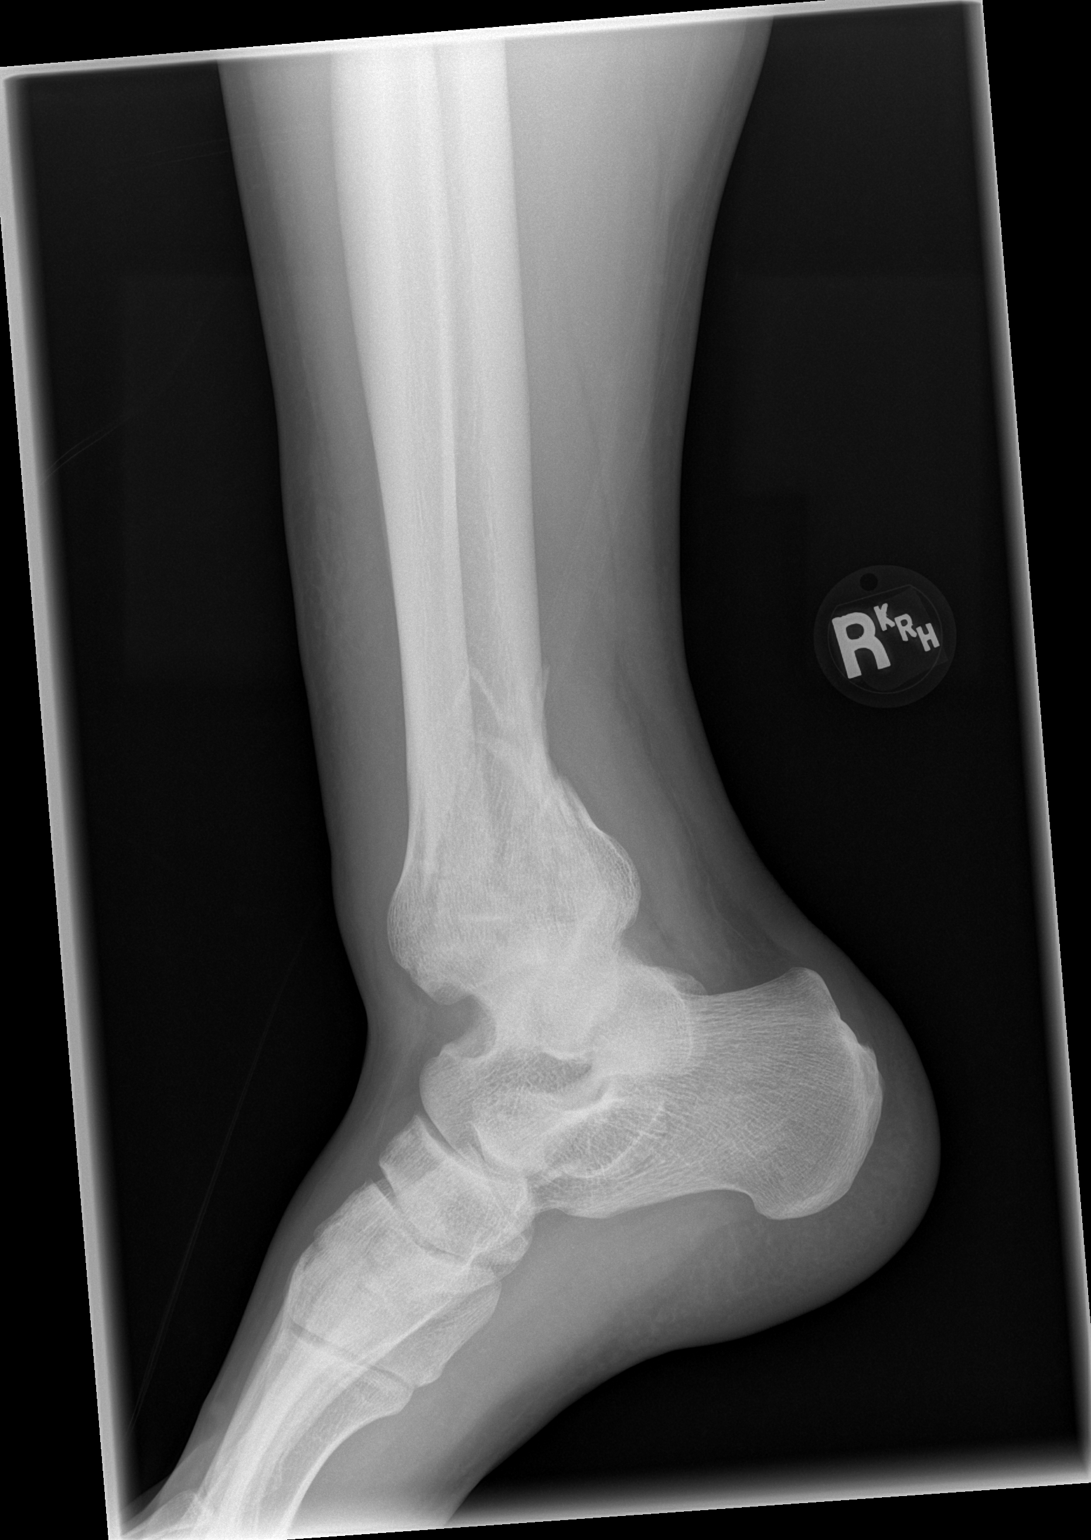

[3 of 3 positions shown; findings below may reference images not displayed]

FINDINGS: A comminuted intra-articular fracture of the distal right tibia is
identified with the major fracture plane extending obliquely through
the a metaphyseal region of the distal right tibia with mild
posterior angulation of the major distal fracture fragments. There
are longitudinal fracture planes extending into the lateral aspect
of the tibial plafond, best noted on AP examination. There is a a
mildly comminuted oblique fracture through the distal right fibular
metaphyseal region also identified extending to the level of the
tibial plafond with [DATE] shaft with 1 shaft with medial displacement
and mild posterior angulation of the distal fracture fragment. The
tibiotalar joint space appears widened. There is extensive
bimalleolar soft tissue swelling.
IMPRESSION: Comminuted intra-articular fractures of the distal right fibula and
tibia as described above.

## 2021-09-19 IMAGING — DX DG ANKLE PORT 2V*R*
1 series · 2 of 2 positions shown · non-contrast
Comparison: 06/11/2021

CLINICAL DATA: ORIF right ankle

EXAM:
PORTABLE RIGHT ANKLE - 2 VIEW

[Series 1: ankle · 0.14mm/px · 2 of 2 slices shown]
[im 1/2]
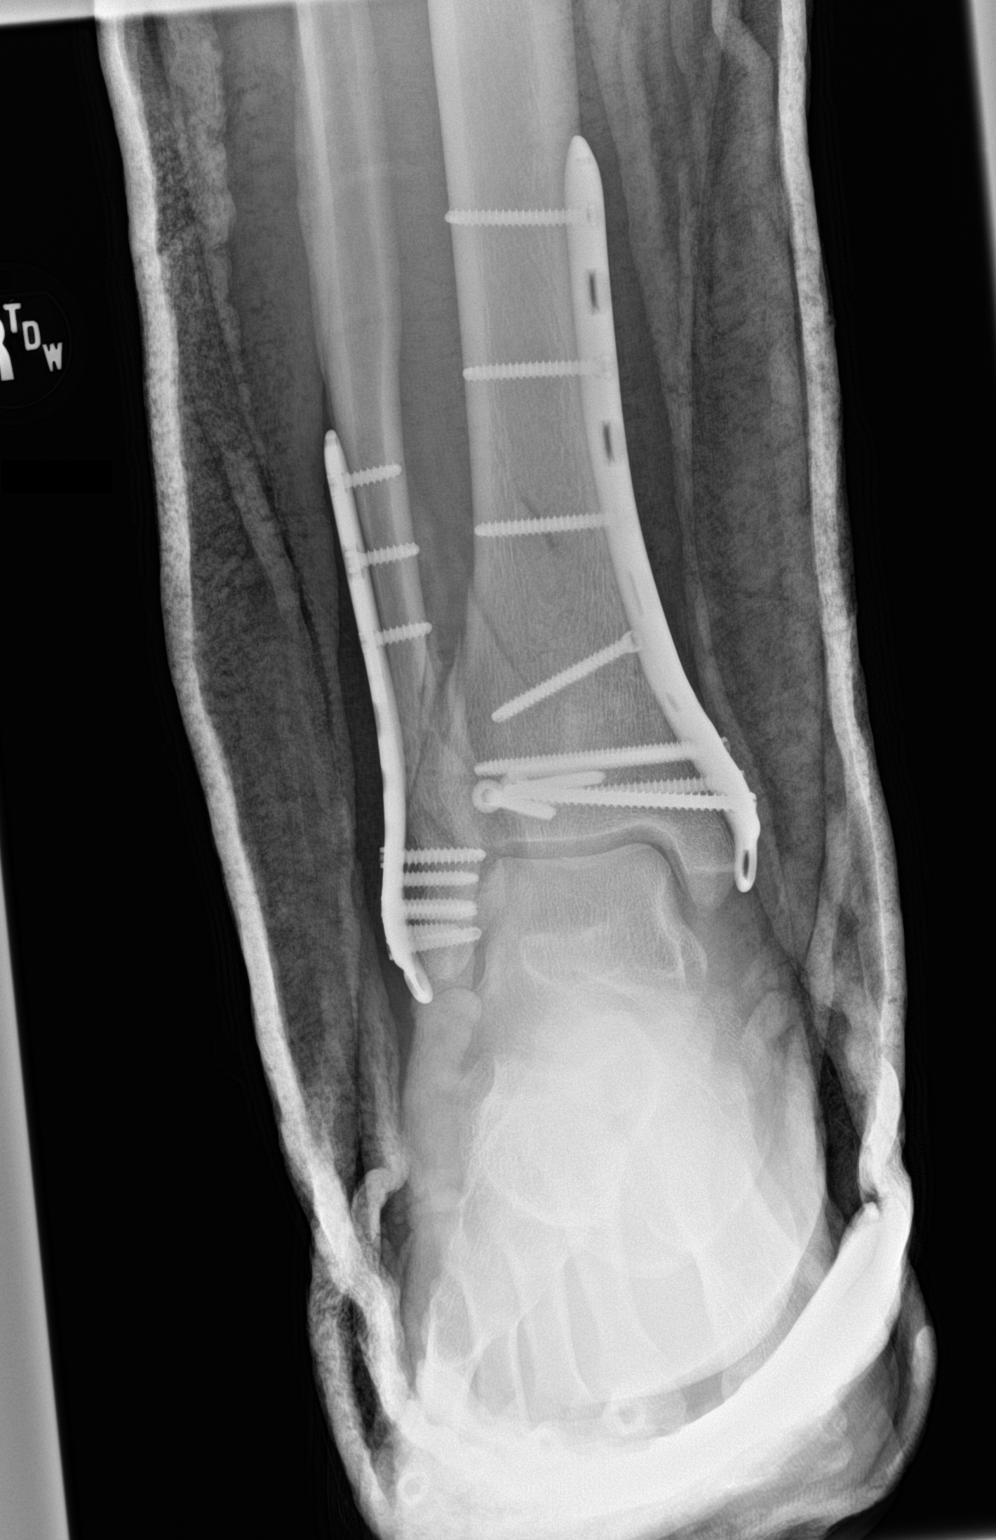
[im 2/2]
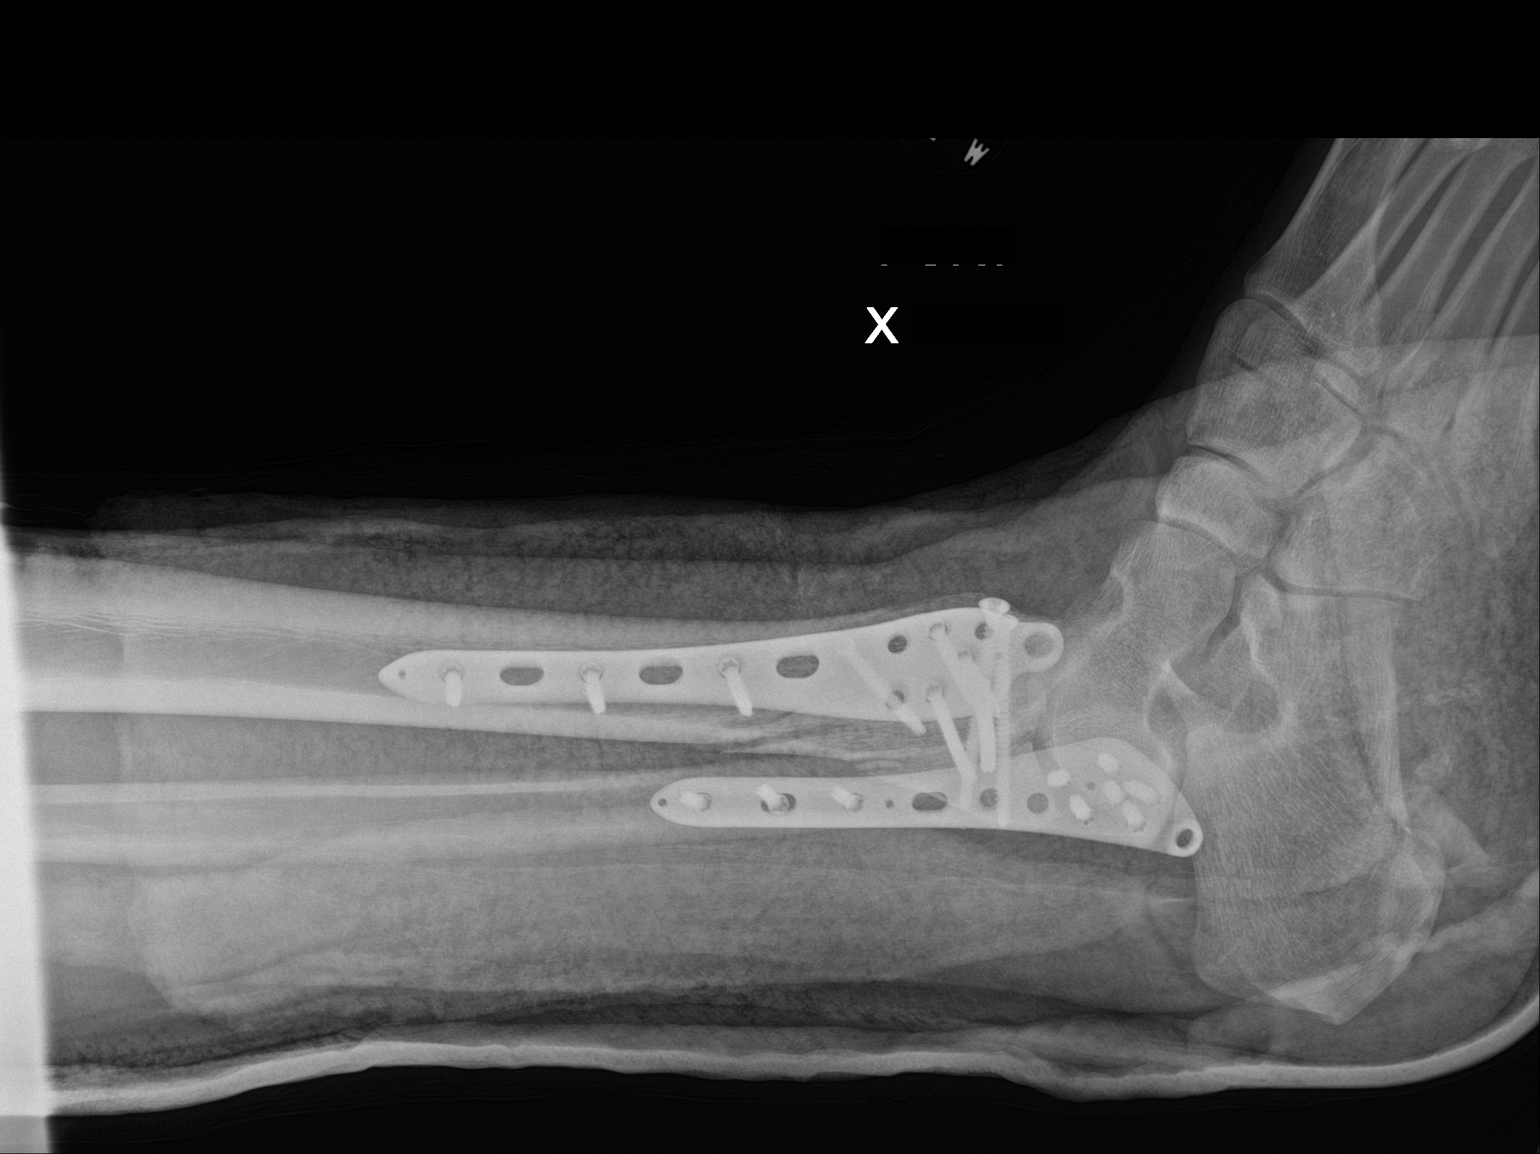

[2 of 2 positions shown; findings below may reference images not displayed]

FINDINGS: Frontal and cross-table lateral views of the right ankle are
obtained. Lateral plate and screw fixation traverses the comminuted
distal fibular fracture with near anatomic alignment. There is a
plate and screw fixation across the medial malleolus and distal
tibial metaphysis, with screws also traversing the tibial plafond.
There is near anatomic alignment of the comminuted tibial fractures
seen previously. There has been reduction of the tibiotalar
dislocation seen previously. Diffuse soft tissue swelling is noted.
Evaluation of the underlying bony detail is limited due to overlying
casting material.
IMPRESSION: 1. ORIF of a trimalleolar fracture as above, with near anatomic
alignment.

## 2023-11-08 ENCOUNTER — Telehealth (INDEPENDENT_AMBULATORY_CARE_PROVIDER_SITE_OTHER): Payer: 59 | Admitting: Family Medicine

## 2023-11-08 ENCOUNTER — Ambulatory Visit: Payer: Self-pay | Admitting: Family

## 2023-11-08 ENCOUNTER — Telehealth: Payer: Self-pay

## 2023-11-08 DIAGNOSIS — Z20828 Contact with and (suspected) exposure to other viral communicable diseases: Secondary | ICD-10-CM

## 2023-11-08 DIAGNOSIS — R051 Acute cough: Secondary | ICD-10-CM

## 2023-11-08 MED ORDER — OSELTAMIVIR PHOSPHATE 75 MG PO CAPS: 75.0000 mg | ORAL_CAPSULE | Freq: Every day | ORAL | 0 refills | Status: AC

## 2023-11-08 NOTE — Telephone Encounter (Signed)
 Called patient regarding VV. Was unable to reach. LVM requesting a return call.

## 2023-11-08 NOTE — Progress Notes (Signed)
 Virtual Visit via Video Note  I connected with Richard Ali on 11/08/23 at 9:30AM by a video enabled telemedicine application and verified that I am speaking with the correct person using two identifiers.  Patient Location: Home Provider Location: Office/Clinic  I discussed the limitations, risks, security, and privacy concerns of performing an evaluation and management service by video and the availability of in person appointments. I also discussed with the patient that there may be a patient responsible charge related to this service. The patient expressed understanding and agreed to proceed.  Subjective: PCP: Lavell Bari LABOR, FNP  Richard Ali is a 38 year old male who presents today for an acute visit with generalized viral complaints. He reports cough, runny rose, feeling run down, intermittent headache, body aches/chills.  He reports onset of symptoms started about 12 hours ago.  He reports that his son went to spend the weekend with his mother, who had the flu, and now he is starting to feel sick.  He is concerned about contracting flu since he has a 87-week-old newborn at home.  He has not tried any over-the-counter medication. Denies fever, N/V, diarrhea, sinus pain/pressure, chest pain, shob, difficulty breathing.   ROS: Per HPI  Current Outpatient Medications:    oseltamivir  (TAMIFLU ) 75 MG capsule, Take 1 capsule (75 mg total) by mouth daily for 7 days., Disp: 7 capsule, Rfl: 0   amphetamine -dextroamphetamine  (ADDERALL) 20 MG tablet, Take 1 tablet (20 mg total) by mouth daily before breakfast. (Patient taking differently: Take 20 mg by mouth See admin instructions. 1-2 times a week), Disp: 30 tablet, Rfl: 0   aspirin  EC 325 MG tablet, Take 1 tablet (325 mg total) by mouth in the morning and at bedtime. (Patient not taking: Reported on 08/11/2021), Disp: 60 tablet, Rfl: 0   methocarbamol  (ROBAXIN ) 500 MG tablet, Take 1 tablet (500 mg total) by mouth every 6 (six)  hours as needed for muscle spasms. (Patient not taking: Reported on 08/11/2021), Disp: 28 tablet, Rfl: 0   ondansetron  (ZOFRAN ) 4 MG tablet, Take 1 tablet (4 mg total) by mouth every 6 (six) hours as needed for nausea. (Patient not taking: Reported on 08/11/2021), Disp: 20 tablet, Rfl: 0  Observations/Objective: There were no vitals filed for this visit.  General: Alert and oriented x 4. Speaking in clear and full sentences, no audible heavy breathing, no acute distress.  Sounds alert and appropriately interactive.  Appears well.  Face symmetric.  Extraocular movements intact.  Pupils equal and round.  No nasal flaring or accessory muscle use visualized.  Assessment and Plan: 1. Acute cough (Primary) Patient presents with generalized viral symptoms that have been present for 12 hours.  Symptoms include cough, fatigue, body aches, and headache.  Discussed limit of virtual visit and inability to perform testing for COVID & influenza. Discussed that this is less likely a viral upper respiratory infection, viral gastroenteritis, pneumonia, or bronchitis.  Discussed symptomatic treatment for viral infection, including use of over-the-counter Advil or Tylenol  and cough medication.  Patient requesting Tamiflu .  Reasonable to send prescription into pharmacy.  Advised patient to follow-up with PCP if symptoms do not improve or become worse. - oseltamivir  (TAMIFLU ) 75 MG capsule; Take 1 capsule (75 mg total) by mouth daily for 7 days.  Dispense: 7 capsule; Refill: 0  2. Exposure to influenza Due to his son being exposed to the flu and having a newborn at home, the patient would like to be treated prophylactically. - oseltamivir  (TAMIFLU ) 75 MG capsule; Take  1 capsule (75 mg total) by mouth daily for 7 days.  Dispense: 7 capsule; Refill: 0   Follow Up Instructions: Return if symptoms worsen or fail to improve.   I discussed the assessment and treatment plan with the patient. The patient was provided an  opportunity to ask questions, and all were answered. The patient agreed with the plan and demonstrated an understanding of the instructions.   The patient was advised to call back or seek an in-person evaluation if the symptoms worsen or if the condition fails to improve as anticipated.  The above assessment and management plan was discussed with the patient. The patient verbalized understanding of and has agreed to the management plan.   Evalene Arts, FNP

## 2023-11-08 NOTE — Telephone Encounter (Signed)
  Chief Complaint: possible flu exposure, requesting tamiflu  Symptoms: mild headache, mild sore throat, productive cough, body aches. Frequency: since last night 9pm Pertinent Negatives: Patient denies SOB, earaches, chills, fever Disposition: [] ED /[] Urgent Care (no appt availability in office) / [x] Appointment(In office/virtual)/ []  [facility]/ [] Home Care/ [] Refused Recommended Disposition /[] [facility]/ []  Follow-up with PCP Additional Notes: Patient states his son is symptomatic and has spent the last 2 weekends with his mother who is flu positive. Patient agreeable to virtual visit with mychart. Patient transferred to E2C2 call agent Nathanel to update his insurance information.   Copied from CRM [id number]. Topic: Clinical - Red Word Triage >> Nov 08, 2023  8:33 AM Susanna ORN wrote: Red Word that prompted transfer to Nurse Triage: Patient trying to be seen today. States he has flu like symptoms along with body aches. States he has no fever as of yet. Reason for Disposition  [1] Patient is NOT HIGH RISK AND [2] strongly requests antiviral medicine AND [3] flu symptoms present < 48 hours  Answer Assessment - Initial Assessment Questions 1. WORST SYMPTOM: What is your worst symptom? (e.g., cough, runny nose, muscle aches, headache, sore throat, fever)      Body aches.  2. ONSET: When did your flu symptoms start?      9pm last night.  3. COUGH: How bad is the cough?       Patient states he took OTC cough suppressant which helped, he states otherwise he doesn't think he wouldn't have been able to sleep.  4. RESPIRATORY DISTRESS: Describe your breathing.      Denies any difficulty breathing.  5. FEVER: Do you have a fever? If Yes, ask: What is your temperature, how was it measured, and when did it start?     Denies.  6. EXPOSURE: Were you exposed to someone with influenza?       He states his eldest son was around his mother who was flu positive last  weekend and this weekend. He states his son returned home and is also symptomatic.  7. FLU VACCINE: Did you get a flu shot this year?     Denies.  8. HIGH RISK DISEASE: Do you have any chronic medical problems? (e.g., heart or lung disease, asthma, weak immune system, or other HIGH RISK conditions)     Denies.  10. OTHER SYMPTOMS: Do you have any other symptoms?  (e.g., runny nose, muscle aches, headache, sore throat)       Cough, runny nose, mild sore throat  Protocols used: Influenza (Flu) - [facility]

## 2023-11-08 NOTE — Patient Instructions (Addendum)
 You have a viral infection that will resolve on its own over time.  Symptomatic treatment is ideal at this time. Symptoms will last 3-7 days but can stretch out to 10-14 days. Call if you are not improving by 7-10 days as this may have progressed to a bacterial infection.  You can use nasal saline to help with nasal drainage/congestion. Humidification may also be beneficial.  Unfortunately, antibiotics are not helpful for viral infections.  Vitamin Regimen:  Vitamin C 500mg  twice daily  Vitamin D 5000 units once daily  Zinc 50-75mg  once daily   Over the counter Medications (*unless allergic or contraindicated*):  Use Tylenol (acetaminophen) for fever/pain Use Advil (ibuprofen) for fever/pain/inflammation   Non-Medication Therapy:  Drink plenty of fluids and stay hydrated.  A teaspoon of honey may help ease coughing symptoms.  Cough drops or hard candy for coughing.   Over the Counter Medication Therapy:  Use a cough expectorant such as guaifenesin (Mucinex) if recommended by your doctor for a wet, congested cough. If you have high blood pressure, please ask your doctor first before using this.  Use a cough suppressant such as dextromethorphan (Robitussin/Delsym) for a dry cough. If you have high blood pressure, please ask your doctor first before using this.  If you have high blood pressure, medication such as Coricidin HBP is safe to take for your cough and will not increase your blood pressure.
